# Patient Record
Sex: Female | Born: 1950 | Race: White | Hispanic: No | Smoking: Never smoker
Health system: Southern US, Community
[De-identification: ages and names within clinical notes are randomized; demographics above are authoritative.]

## PROBLEM LIST (undated history)

## (undated) DIAGNOSIS — M1990 Unspecified osteoarthritis, unspecified site: Secondary | ICD-10-CM

## (undated) DIAGNOSIS — I10 Essential (primary) hypertension: Secondary | ICD-10-CM

## (undated) DIAGNOSIS — F329 Major depressive disorder, single episode, unspecified: Secondary | ICD-10-CM

## (undated) DIAGNOSIS — C801 Malignant (primary) neoplasm, unspecified: Secondary | ICD-10-CM

## (undated) DIAGNOSIS — F32A Depression, unspecified: Secondary | ICD-10-CM

## (undated) HISTORY — PX: TONSILLECTOMY: SUR1361

## (undated) HISTORY — PX: WISDOM TOOTH EXTRACTION: SHX21

## (undated) HISTORY — PX: FOOT SURGERY: SHX648

## (undated) HISTORY — PX: CHOLECYSTECTOMY: SHX55

---

## 2006-12-30 ENCOUNTER — Emergency Department: Payer: Self-pay | Admitting: Unknown Physician Specialty

## 2006-12-30 ENCOUNTER — Other Ambulatory Visit: Payer: Self-pay

## 2008-04-09 ENCOUNTER — Ambulatory Visit: Payer: Self-pay | Admitting: Gastroenterology

## 2008-06-23 ENCOUNTER — Ambulatory Visit: Payer: Self-pay | Admitting: General Surgery

## 2008-07-18 ENCOUNTER — Other Ambulatory Visit: Payer: Self-pay

## 2008-07-18 ENCOUNTER — Ambulatory Visit: Payer: Self-pay | Admitting: General Surgery

## 2008-07-24 ENCOUNTER — Ambulatory Visit: Payer: Self-pay | Admitting: General Surgery

## 2008-09-06 ENCOUNTER — Emergency Department: Payer: Self-pay | Admitting: Emergency Medicine

## 2008-09-15 ENCOUNTER — Ambulatory Visit: Payer: Self-pay | Admitting: Internal Medicine

## 2009-05-25 ENCOUNTER — Ambulatory Visit: Payer: Self-pay | Admitting: Gastroenterology

## 2013-04-17 ENCOUNTER — Ambulatory Visit: Payer: Self-pay | Admitting: Family Medicine

## 2013-07-31 ENCOUNTER — Emergency Department: Payer: Self-pay | Admitting: Emergency Medicine

## 2014-05-20 ENCOUNTER — Ambulatory Visit: Payer: Self-pay | Admitting: Family Medicine

## 2015-05-15 ENCOUNTER — Encounter: Payer: Self-pay | Admitting: *Deleted

## 2015-05-15 ENCOUNTER — Encounter
Admission: RE | Disposition: A | Discharge status: Home / Self Care | Payer: Self-pay | Source: Ambulatory Visit | Attending: Unknown Physician Specialty

## 2015-05-15 ENCOUNTER — Ambulatory Visit: Payer: BLUE CROSS/BLUE SHIELD | Admitting: Anesthesiology

## 2015-05-15 ENCOUNTER — Ambulatory Visit
Admission: RE | Admit: 2015-05-15 | Discharge: 2015-05-15 | Disposition: A | Discharge status: Home / Self Care | Payer: BLUE CROSS/BLUE SHIELD | Source: Ambulatory Visit | Attending: Unknown Physician Specialty | Admitting: Unknown Physician Specialty

## 2015-05-15 DIAGNOSIS — Z79899 Other long term (current) drug therapy: Secondary | ICD-10-CM | POA: Insufficient documentation

## 2015-05-15 DIAGNOSIS — Z8371 Family history of colonic polyps: Secondary | ICD-10-CM | POA: Insufficient documentation

## 2015-05-15 DIAGNOSIS — Z7982 Long term (current) use of aspirin: Secondary | ICD-10-CM | POA: Diagnosis not present

## 2015-05-15 DIAGNOSIS — Z1211 Encounter for screening for malignant neoplasm of colon: Secondary | ICD-10-CM | POA: Diagnosis present

## 2015-05-15 DIAGNOSIS — D122 Benign neoplasm of ascending colon: Secondary | ICD-10-CM | POA: Diagnosis not present

## 2015-05-15 DIAGNOSIS — K648 Other hemorrhoids: Secondary | ICD-10-CM | POA: Diagnosis not present

## 2015-05-15 DIAGNOSIS — I10 Essential (primary) hypertension: Secondary | ICD-10-CM | POA: Insufficient documentation

## 2015-05-15 HISTORY — PX: COLONOSCOPY WITH PROPOFOL: SHX5780

## 2015-05-15 HISTORY — DX: Major depressive disorder, single episode, unspecified: F32.9

## 2015-05-15 HISTORY — DX: Essential (primary) hypertension: I10

## 2015-05-15 HISTORY — DX: Depression, unspecified: F32.A

## 2015-05-15 SURGERY — COLONOSCOPY WITH PROPOFOL
Anesthesia: General

## 2015-05-15 MED ORDER — PROPOFOL INFUSION 10 MG/ML OPTIME
INTRAVENOUS | Status: DC | PRN
  Administered 2015-05-15: 140 ug/kg/min via INTRAVENOUS

## 2015-05-15 MED ORDER — PROPOFOL 10 MG/ML IV BOLUS
INTRAVENOUS | Status: DC | PRN
  Administered 2015-05-15: 50 mg via INTRAVENOUS

## 2015-05-15 MED ORDER — SODIUM CHLORIDE 0.9 % IV SOLN: INTRAVENOUS | Status: DC

## 2015-05-15 MED ORDER — LIDOCAINE HCL (PF) 2 % IJ SOLN
INTRAMUSCULAR | Status: DC | PRN
  Administered 2015-05-15: 50 mg

## 2015-05-15 MED ORDER — FENTANYL CITRATE (PF) 100 MCG/2ML IJ SOLN
INTRAMUSCULAR | Status: DC | PRN
  Administered 2015-05-15: 50 ug via INTRAVENOUS

## 2015-05-15 MED ORDER — MIDAZOLAM HCL 5 MG/5ML IJ SOLN
INTRAMUSCULAR | Status: DC | PRN
  Administered 2015-05-15: 1 mg via INTRAVENOUS

## 2015-05-15 MED ORDER — SODIUM CHLORIDE 0.9 % IV SOLN
INTRAVENOUS | Status: DC
  Administered 2015-05-15: 14:00:00 via INTRAVENOUS

## 2015-05-15 NOTE — Anesthesia Postprocedure Evaluation (Signed)
  Anesthesia Post-op Note  Patient: Laurie Ryan  Procedure(s) Performed: Procedure(s): COLONOSCOPY WITH PROPOFOL (N/A)  Anesthesia type:General  Patient location: PACU  Post pain: Pain level controlled  Post assessment: Post-op Vital signs reviewed, Patient's Cardiovascular Status Stable, Respiratory Function Stable, Patent Airway and No signs of Nausea or vomiting  Post vital signs: Reviewed and stable  Last Vitals:  Filed Vitals:   05/15/15 1416  BP: 136/76  Pulse: 70  Temp:   Resp: 20    Level of consciousness: awake, alert  and patient cooperative  Complications: No apparent anesthesia complications

## 2015-05-15 NOTE — Anesthesia Preprocedure Evaluation (Addendum)
Anesthesia Evaluation  Patient identified by MRN, date of birth, ID band Patient awake    Reviewed: Allergy & Precautions, H&P , NPO status , Patient's Chart, lab work & pertinent test results, reviewed documented beta blocker date and time   Airway Mallampati: II  TM Distance: >3 FB Neck ROM: full    Dental  (+) Poor Dentition, Chipped   Pulmonary neg pulmonary ROS,  breath sounds clear to auscultation  Pulmonary exam normal       Cardiovascular hypertension, - Past MI Normal cardiovascular examRhythm:regular Rate:Normal     Neuro/Psych PSYCHIATRIC DISORDERS negative neurological ROS     GI/Hepatic negative GI ROS, Neg liver ROS,   Endo/Other  negative endocrine ROS  Renal/GU negative Renal ROS  negative genitourinary   Musculoskeletal   Abdominal   Peds  Hematology negative hematology ROS (+)   Anesthesia Other Findings Past Medical History:   Hypertension                                                 Depression                                                   Reproductive/Obstetrics negative OB ROS                            Anesthesia Physical Anesthesia Plan  ASA: II  Anesthesia Plan: General   Post-op Pain Management:    Induction:   Airway Management Planned:   Additional Equipment:   Intra-op Plan:   Post-operative Plan:   Informed Consent: I have reviewed the patients History and Physical, chart, labs and discussed the procedure including the risks, benefits and alternatives for the proposed anesthesia with the patient or authorized representative who has indicated his/her understanding and acceptance.     Plan Discussed with: Anesthesiologist, CRNA and Surgeon  Anesthesia Plan Comments:        Anesthesia Quick Evaluation

## 2015-05-15 NOTE — Transfer of Care (Signed)
Immediate Anesthesia Transfer of Care Note  Patient: Laurie Ryan  Procedure(s) Performed: Procedure(s): COLONOSCOPY WITH PROPOFOL (N/A)  Patient Location: PACU  Anesthesia Type:General  Level of Consciousness: sedated  Airway & Oxygen Therapy: Patient Spontanous Breathing and Patient connected to nasal cannula oxygen  Post-op Assessment: Report given to RN and Post -op Vital signs reviewed and stable  Post vital signs: Reviewed and stable  Last Vitals:  Filed Vitals:   05/15/15 1309  BP: 153/87  Pulse: 87  Temp: 36.3 C  Resp: 17    Complications: No apparent anesthesia complications

## 2015-05-15 NOTE — Op Note (Signed)
[facility] Patient Name: Laurie Ryan Procedure Date: 05/15/2015 1:31 PM MRN: [id number] Account #: [id number] Date of Birth: 12/30/1950 Admit Type: Outpatient Age: 64 Room: [facility] ENDO ROOM 1 Gender: Female Note Status: Finalized Procedure:         Colonoscopy Indications:       Colon cancer screening in patient at increased risk:                     Family history of colon polyps Providers:         Manya Silvas, MD Referring MD:      Irven Easterly. Kary Kos, MD (Referring MD) Medicines:         Propofol per Anesthesia Complications:     No immediate complications. Procedure:         Pre-Anesthesia Assessment:                    - After reviewing the risks and benefits, the patient was                     deemed in satisfactory condition to undergo the procedure.                    After obtaining informed consent, the colonoscope was                     passed under direct vision. Throughout the procedure, the                     patient's blood pressure, pulse, and oxygen saturations                     were monitored continuously. The Colonoscope was                     introduced through the anus and advanced to the the cecum,                     identified by appendiceal orifice and ileocecal valve. The                     colonoscopy was performed without difficulty. The patient                     tolerated the procedure well. The quality of the bowel                     preparation was excellent. Findings:      A diminutive polyp was found in the ascending colon. The polyp was       sessile. The polyp was removed with a jumbo cold forceps. Resection and       retrieval were complete.      The exam was otherwise without abnormality.      Internal hemorrhoids were found during endoscopy. The hemorrhoids were       small. Impression:        - One diminutive polyp in the ascending colon. Resected                     and retrieved.           - The examination was otherwise normal. Recommendation:    - Await pathology results. Manya Silvas, MD 05/15/2015 1:59:48 PM This report has been signed electronically. Number  of Addenda: 0 Note Initiated On: 05/15/2015 1:31 PM Scope Withdrawal Time: 0 hours 14 minutes 33 seconds  Total Procedure Duration: 0 hours 18 minutes 33 seconds       [facility]

## 2015-05-15 NOTE — H&P (Signed)
Primary Care Physician:  Maryland Pink, MD Primary Gastroenterologist:  Dr. Vira Agar  Pre-Procedure History & Physical: HPI:  Laurie Ryan is a 64 y.o. female is here for an colonoscopy.   Past Medical History  Diagnosis Date  . Hypertension   . Depression     Past Surgical History  Procedure Laterality Date  . Foot surgery    . Tonsillectomy    . Cholecystectomy      Prior to Admission medications   Medication Sig Start Date End Date Taking? Authorizing Provider  aspirin EC 81 MG tablet Take 81 mg by mouth daily.   Yes Historical Provider, MD  benazepril (LOTENSIN) 5 MG tablet Take 5 mg by mouth daily.   Yes Historical Provider, MD  escitalopram (LEXAPRO) 10 MG tablet Take 10 mg by mouth daily.   Yes Historical Provider, MD  estradiol (ESTRACE) 0.1 MG/GM vaginal cream Place 1 Applicatorful vaginally at bedtime.   Yes Historical Provider, MD  fluticasone (FLONASE) 50 MCG/ACT nasal spray Place 2 sprays into both nostrils daily.   Yes Historical Provider, MD  hydrochlorothiazide (HYDRODIURIL) 25 MG tablet Take 25 mg by mouth daily.   Yes Historical Provider, MD  Multiple Vitamin (MULTIVITAMIN) capsule Take 1 capsule by mouth daily.   Yes Historical Provider, MD  omeprazole (PRILOSEC) 20 MG capsule Take 20 mg by mouth daily.   Yes Historical Provider, MD    Allergies as of 03/31/2015  . (Not on File)    Family History  Problem Relation Age of Onset  . Heart disease Mother   . Heart disease Brother     History   Social History  . Marital Status: Married    Spouse Name: N/A  . Number of Children: N/A  . Years of Education: N/A   Occupational History  . Not on file.   Social History Main Topics  . Smoking status: Never Smoker   . Smokeless tobacco: Not on file  . Alcohol Use: No  . Drug Use: No  . Sexual Activity: Not on file   Other Topics Concern  . Not on file   Social History Narrative    Review of Systems: See HPI, otherwise negative  ROS  Physical Exam: BP 153/87 mmHg  Pulse 87  Temp(Src) 97.4 F (36.3 C) (Tympanic)  Resp 17  Ht 5\' 6"  (1.676 m)  Wt 65.772 kg (145 lb)  BMI 23.41 kg/m2 General:   Alert,  pleasant and cooperative in NAD Head:  Normocephalic and atraumatic. Neck:  Supple; no masses or thyromegaly. Lungs:  Clear throughout to auscultation.    Heart:  Regular rate and rhythm. Abdomen:  Soft, nontender and nondistended. Normal bowel sounds, without guarding, and without rebound.   Neurologic:  Alert and  oriented x4;  grossly normal neurologically.  Impression/Plan: Laurie Ryan is here for an colonoscopy to be performed for screening colon for family history of colon polyps  Risks, benefits, limitations, and alternatives regarding  colonoscopy have been reviewed with the patient.  Questions have been answered.  All parties agreeable.   Gaylyn Cheers, MD  05/15/2015, 1:33 PM   Primary Care Physician:  Mary[place] Pink, MD Primary Gastroenterologist:  Dr. Vira Agar  Pre-Procedure History & Physical: HPI:  Laurie Ryan is a 64 y.o. female is here for an colonoscopy.   Past Medical History  Diagnosis Date  . Hypertension   . Depression     Past Surgical History  Procedure Laterality Date  . Foot surgery    .  Tonsillectomy    . Cholecystectomy      Prior to Admission medications   Medication Sig Start Date End Date Taking? Authorizing Provider  aspirin EC 81 MG tablet Take 81 mg by mouth daily.   Yes Historical Provider, MD  benazepril (LOTENSIN) 5 MG tablet Take 5 mg by mouth daily.   Yes Historical Provider, MD  escitalopram (LEXAPRO) 10 MG tablet Take 10 mg by mouth daily.   Yes Historical Provider, MD  estradiol (ESTRACE) 0.1 MG/GM vaginal cream Place 1 Applicatorful vaginally at bedtime.   Yes Historical Provider, MD  fluticasone (FLONASE) 50 MCG/ACT nasal spray Place 2 sprays into both nostrils daily.   Yes Historical Provider, MD  hydrochlorothiazide (HYDRODIURIL) 25 MG  tablet Take 25 mg by mouth daily.   Yes Historical Provider, MD  Multiple Vitamin (MULTIVITAMIN) capsule Take 1 capsule by mouth daily.   Yes Historical Provider, MD  omeprazole (PRILOSEC) 20 MG capsule Take 20 mg by mouth daily.   Yes Historical Provider, MD    Allergies as of 03/31/2015  . (Not on File)    Family History  Problem Relation Age of Onset  . Heart disease Mother   . Heart disease Brother     History   Social History  . Marital Status: Married    Spouse Name: N/A  . Number of Children: N/A  . Years of Education: N/A   Occupational History  . Not on file.   Social History Main Topics  . Smoking status: Never Smoker   . Smokeless tobacco: Not on file  . Alcohol Use: No  . Drug Use: No  . Sexual Activity: Not on file   Other Topics Concern  . Not on file   Social History Narrative    Review of Systems: See HPI, otherwise negative ROS  Physical Exam: BP 153/87 mmHg  Pulse 87  Temp(Src) 97.4 F (36.3 C) (Tympanic)  Resp 17  Ht 5\' 6"  (1.676 m)  Wt 65.772 kg (145 lb)  BMI 23.41 kg/m2 General:   Alert,  pleasant and cooperative in NAD Head:  Normocephalic and atraumatic. Neck:  Supple; no masses or thyromegaly. Lungs:  Clear throughout to auscultation.    Heart:  Regular rate and rhythm. Abdomen:  Soft, nontender and nondistended. Normal bowel sounds, without guarding, and without rebound.   Neurologic:  Alert and  oriented x4;  grossly normal neurologically.  Impression/Plan: Laurie Ryan is here for an colonoscopy to be performed for screening colonoscopy.  Risks, benefits, limitations, and alternatives regarding  colonoscopy have been reviewed with the patient.  Questions have been answered.  All parties agreeable.   Gaylyn Cheers, MD  05/15/2015, 1:33 PM

## 2015-05-18 ENCOUNTER — Encounter: Payer: Self-pay | Admitting: Unknown Physician Specialty

## 2015-05-19 LAB — SURGICAL PATHOLOGY

## 2015-08-07 ENCOUNTER — Other Ambulatory Visit: Payer: Self-pay | Admitting: Family Medicine

## 2015-08-07 DIAGNOSIS — Z1231 Encounter for screening mammogram for malignant neoplasm of breast: Secondary | ICD-10-CM

## 2015-08-21 ENCOUNTER — Ambulatory Visit: Admission: RE | Admit: 2015-08-21 | Payer: BLUE CROSS/BLUE SHIELD | Source: Ambulatory Visit

## 2015-08-28 ENCOUNTER — Ambulatory Visit
Admission: RE | Admit: 2015-08-28 | Discharge: 2015-08-28 | Disposition: A | Discharge status: Home / Self Care | Payer: BLUE CROSS/BLUE SHIELD | Source: Ambulatory Visit | Attending: Family Medicine | Admitting: Family Medicine

## 2015-08-28 DIAGNOSIS — Z1231 Encounter for screening mammogram for malignant neoplasm of breast: Secondary | ICD-10-CM | POA: Insufficient documentation

## 2015-08-28 HISTORY — DX: Malignant (primary) neoplasm, unspecified: C80.1

## 2015-09-02 ENCOUNTER — Other Ambulatory Visit: Payer: Self-pay | Admitting: Family Medicine

## 2015-09-02 DIAGNOSIS — R928 Other abnormal and inconclusive findings on diagnostic imaging of breast: Secondary | ICD-10-CM

## 2015-09-17 ENCOUNTER — Ambulatory Visit
Admission: RE | Admit: 2015-09-17 | Discharge: 2015-09-17 | Disposition: A | Discharge status: Home / Self Care | Payer: BLUE CROSS/BLUE SHIELD | Source: Ambulatory Visit | Attending: Family Medicine | Admitting: Family Medicine

## 2015-09-17 DIAGNOSIS — R928 Other abnormal and inconclusive findings on diagnostic imaging of breast: Secondary | ICD-10-CM | POA: Insufficient documentation

## 2016-02-10 ENCOUNTER — Encounter: Payer: Self-pay | Admitting: Urgent Care

## 2016-02-10 DIAGNOSIS — Z7982 Long term (current) use of aspirin: Secondary | ICD-10-CM | POA: Diagnosis not present

## 2016-02-10 DIAGNOSIS — F329 Major depressive disorder, single episode, unspecified: Secondary | ICD-10-CM | POA: Insufficient documentation

## 2016-02-10 DIAGNOSIS — Z79899 Other long term (current) drug therapy: Secondary | ICD-10-CM | POA: Insufficient documentation

## 2016-02-10 DIAGNOSIS — R0789 Other chest pain: Secondary | ICD-10-CM | POA: Insufficient documentation

## 2016-02-10 DIAGNOSIS — Z85828 Personal history of other malignant neoplasm of skin: Secondary | ICD-10-CM | POA: Insufficient documentation

## 2016-02-10 DIAGNOSIS — I10 Essential (primary) hypertension: Secondary | ICD-10-CM | POA: Diagnosis not present

## 2016-02-10 DIAGNOSIS — J209 Acute bronchitis, unspecified: Secondary | ICD-10-CM | POA: Insufficient documentation

## 2016-02-10 NOTE — ED Notes (Signed)
Patient presents to ED with c/p retrosternal CP that began while patient was sitting. (+) nausea, SOB, and elevated BP. Of note, patient has bbeen "sick" x 2 weeks with respiratory illness; better now. (+) N/V/D last couple of weeks as well.

## 2016-02-11 ENCOUNTER — Other Ambulatory Visit: Payer: BLUE CROSS/BLUE SHIELD

## 2016-02-11 ENCOUNTER — Emergency Department: Payer: BLUE CROSS/BLUE SHIELD

## 2016-02-11 ENCOUNTER — Emergency Department
Admission: EM | Admit: 2016-02-11 | Discharge: 2016-02-11 | Disposition: A | Discharge status: Home / Self Care | Payer: BLUE CROSS/BLUE SHIELD | Attending: Emergency Medicine | Admitting: Emergency Medicine

## 2016-02-11 DIAGNOSIS — J40 Bronchitis, not specified as acute or chronic: Secondary | ICD-10-CM

## 2016-02-11 DIAGNOSIS — R079 Chest pain, unspecified: Secondary | ICD-10-CM

## 2016-02-11 LAB — BASIC METABOLIC PANEL
ANION GAP: 5 (ref 5–15)
BUN: 16 mg/dL (ref 6–20)
CALCIUM: 8.7 mg/dL — AB (ref 8.9–10.3)
CO2: 25 mmol/L (ref 22–32)
Chloride: 109 mmol/L (ref 101–111)
Creatinine, Ser: 0.98 mg/dL (ref 0.44–1.00)
GFR calc Af Amer: >60 mL/min (ref >60)
GFR calc non Af Amer: >60 mL/min (ref >60)
GLUCOSE: 164 mg/dL — AB (ref 65–99)
POTASSIUM: 3.8 mmol/L (ref 3.5–5.1)
Sodium: 139 mmol/L (ref 135–145)

## 2016-02-11 LAB — CBC
HCT: 40.2 % (ref 35.0–47.0)
HEMOGLOBIN: 13.8 g/dL (ref 12.0–16.0)
MCH: 32 pg (ref 26.0–34.0)
MCHC: 34.3 g/dL (ref 32.0–36.0)
MCV: 93.3 fL (ref 80.0–100.0)
Platelets: 154 10*3/uL (ref 150–440)
RBC: 4.3 MIL/uL (ref 3.80–5.20)
RDW: 12.5 % (ref 11.5–14.5)
WBC: 4.2 10*3/uL (ref 3.6–11.0)

## 2016-02-11 LAB — TROPONIN I: Troponin I: <0.03 ng/mL (ref <0.031)

## 2016-02-11 MED ORDER — AZITHROMYCIN 250 MG PO TABS: 250.0000 mg | ORAL_TABLET | Freq: Every day | ORAL | Status: DC

## 2016-02-11 MED ORDER — METHYLPREDNISOLONE 4 MG PO TBPK: ORAL_TABLET | ORAL | Status: DC

## 2016-02-11 MED ORDER — AZITHROMYCIN 500 MG PO TABS
500.0000 mg | ORAL_TABLET | Freq: Once | ORAL | Status: AC
  Administered 2016-02-11: 500 mg via ORAL
  Filled 2016-02-11: qty 1

## 2016-02-11 MED ORDER — PREDNISONE 20 MG PO TABS
40.0000 mg | ORAL_TABLET | Freq: Once | ORAL | Status: AC
  Administered 2016-02-11: 40 mg via ORAL
  Filled 2016-02-11: qty 2

## 2016-02-11 MED ORDER — ALBUTEROL SULFATE HFA 108 (90 BASE) MCG/ACT IN AERS: 2.0000 | INHALATION_SPRAY | RESPIRATORY_TRACT | Status: DC | PRN

## 2016-02-11 NOTE — ED Notes (Signed)
Pt reports chest pressure " for 2 weeks" Pt reports having cough and URI. Pt deneis CP or other symptoms. Pt was seen at clinic and advised to get an over the counter medication.  Pt in no acute distress

## 2016-02-11 NOTE — Discharge Instructions (Signed)
1. Take antibiotic as prescribed (Azithromycin 250 mg daily 4 days). 2. Take steroid taper as prescribed (Medrol Dosepak). 3. You may use albuterol inhaler 2 puffs every 4 hours as needed for cough or wheezing. 4. Return to the ER for worsening symptoms, persistent vomiting, difficulty breathing or other concerns.  Upper Respiratory Infection, Adult Most upper respiratory infections (URIs) are a viral infection of the air passages leading to the lungs. A URI affects the nose, throat, and upper air passages. The most common type of URI is nasopharyngitis and is typically referred to as "the common cold." URIs run their course and usually go away on their own. Most of the time, a URI does not require medical attention, but sometimes a bacterial infection in the upper airways can follow a viral infection. This is called a secondary infection. Sinus and middle ear infections are common types of secondary upper respiratory infections. Bacterial pneumonia can also complicate a URI. A URI can worsen asthma and chronic obstructive pulmonary disease (COPD). Sometimes, these complications can require emergency medical care and may be life threatening.  CAUSES Almost all URIs are caused by viruses. A virus is a type of germ and can spread from one person to another.  RISKS FACTORS You may be at risk for a URI if:  1. You smoke.  2. You have chronic heart or lung disease. 3. You have a weakened defense (immune) system.  4. You are very young or very old.  5. You have nasal allergies or asthma. 6. You work in crowded or poorly ventilated areas. 7. You work in health care facilities or schools. SIGNS AND SYMPTOMS  Symptoms typically develop 2-3 days after you come in contact with a cold virus. Most viral URIs last 7-10 days. However, viral URIs from the influenza virus (flu virus) can last 14-18 days and are typically more severe. Symptoms may include:   Runny or stuffy (congested) nose.   Sneezing.    Cough.   Sore throat.   Headache.   Fatigue.   Fever.   Loss of appetite.   Pain in your forehead, behind your eyes, and over your cheekbones (sinus pain).  Muscle aches.  DIAGNOSIS  Your health care provider may diagnose a URI by:  Physical exam.  Tests to check that your symptoms are not due to another condition such as:  Strep throat.  Sinusitis.  Pneumonia.  Asthma. TREATMENT  A URI goes away on its own with time. It cannot be cured with medicines, but medicines may be prescribed or recommended to relieve symptoms. Medicines may help:  Reduce your fever.  Reduce your cough.  Relieve nasal congestion. HOME CARE INSTRUCTIONS   Take medicines only as directed by your health care provider.   Gargle warm saltwater or take cough drops to comfort your throat as directed by your health care provider.  Use a warm mist humidifier or inhale steam from a shower to increase air moisture. This may make it easier to breathe.  Drink enough fluid to keep your urine clear or pale yellow.   Eat soups and other clear broths and maintain good nutrition.   Rest as needed.   Return to work when your temperature has returned to normal or as your health care provider advises. You may need to stay home longer to avoid infecting others. You can also use a face mask and careful hand washing to prevent spread of the virus.  Increase the usage of your inhaler if you have asthma.  Do not use any tobacco products, including cigarettes, chewing tobacco, or electronic cigarettes. If you need help quitting, ask your health care provider. PREVENTION  The best way to protect yourself from getting a cold is to practice good hygiene.   Avoid oral or hand contact with people with cold symptoms.   Wash your hands often if contact occurs.  There is no clear evidence that vitamin C, vitamin E, echinacea, or exercise reduces the chance of developing a cold. However, it is  always recommended to get plenty of rest, exercise, and practice good nutrition.  SEEK MEDICAL CARE IF:   You are getting worse rather than better.   Your symptoms are not controlled by medicine.   You have chills.  You have worsening shortness of breath.  You have brown or red mucus.  You have yellow or brown nasal discharge.  You have pain in your face, especially when you bend forward.  You have a fever.  You have swollen neck glands.  You have pain while swallowing.  You have white areas in the back of your throat. SEEK IMMEDIATE MEDICAL CARE IF:   You have severe or persistent:  Headache.  Ear pain.  Sinus pain.  Chest pain.  You have chronic lung disease and any of the following:  Wheezing.  Prolonged cough.  Coughing up blood.  A change in your usual mucus.  You have a stiff neck.  You have changes in your:  Vision.  Hearing.  Thinking.  Mood. MAKE SURE YOU:   Understand these instructions.  Will watch your condition.  Will get help right away if you are not doing well or get worse.   This information is not intended to replace advice given to you by your health care provider. Make sure you discuss any questions you have with your health care provider.   Document Released: 04/19/2001 Document Revised: 03/10/2015 Document Reviewed: 01/29/2014 Elsevier Interactive Patient Education 2016 [employer]  Nonspecific Chest Pain  Chest pain can be caused by many different conditions. There is always a chance that your pain could be related to something serious, such as a heart attack or a blood clot in your lungs. Chest pain can also be caused by conditions that are not life-threatening. If you have chest pain, it is very important to follow up with your health care provider. CAUSES  Chest pain can be caused by: 8. Heartburn. 9. Pneumonia or bronchitis. 10. Anxiety or stress. 11. Inflammation around your heart (pericarditis) or lung  (pleuritis or pleurisy). 12. A blood clot in your lung. 13. A collapsed lung (pneumothorax). It can develop suddenly on its own (spontaneous pneumothorax) or from trauma to the chest. 14. Shingles infection (varicella-zoster virus). 15. Heart attack. 16. Damage to the bones, muscles, and cartilage that make up your chest wall. This can include: 1. Bruised bones due to injury. 2. Strained muscles or cartilage due to frequent or repeated coughing or overwork. 3. Fracture to one or more ribs. 4. Sore cartilage due to inflammation (costochondritis). RISK FACTORS  Risk factors for chest pain may include:  Activities that increase your risk for trauma or injury to your chest.  Respiratory infections or conditions that cause frequent coughing.  Medical conditions or overeating that can cause heartburn.  Heart disease or family history of heart disease.  Conditions or health behaviors that increase your risk of developing a blood clot.  Having had chicken pox (varicella zoster). SIGNS AND SYMPTOMS Chest pain can feel like:  Burning  or tingling on the surface of your chest or deep in your chest.  Crushing, pressure, aching, or squeezing pain.  Dull or sharp pain that is worse when you move, cough, or take a deep breath.  Pain that is also felt in your back, neck, shoulder, or arm, or pain that spreads to any of these areas. Your chest pain may come and go, or it may stay constant. DIAGNOSIS Lab tests or other studies may be needed to find the cause of your pain. Your health care provider may have you take a test called an ambulatory ECG (electrocardiogram). An ECG records your heartbeat patterns at the time the test is performed. You may also have other tests, such as:  Transthoracic echocardiogram (TTE). During echocardiography, sound waves are used to create a picture of all of the heart structures and to look at how blood flows through your heart.  Transesophageal echocardiogram  (TEE).This is a more advanced imaging test that obtains images from inside your body. It allows your health care provider to see your heart in finer detail.  Cardiac monitoring. This allows your health care provider to monitor your heart rate and rhythm in real time.  Holter monitor. This is a portable device that records your heartbeat and can help to diagnose abnormal heartbeats. It allows your health care provider to track your heart activity for several days, if needed.  Stress tests. These can be done through exercise or by taking medicine that makes your heart beat more quickly.  Blood tests.  Imaging tests. TREATMENT  Your treatment depends on what is causing your chest pain. Treatment may include:  Medicines. These may include:  Acid blockers for heartburn.  Anti-inflammatory medicine.  Pain medicine for inflammatory conditions.  Antibiotic medicine, if an infection is present.  Medicines to dissolve blood clots.  Medicines to treat coronary artery disease.  Supportive care for conditions that do not require medicines. This may include:  Resting.  Applying heat or cold packs to injured areas.  Limiting activities until pain decreases. HOME CARE INSTRUCTIONS  If you were prescribed an antibiotic medicine, finish it all even if you start to feel better.  Avoid any activities that bring on chest pain.  Do not use any tobacco products, including cigarettes, chewing tobacco, or electronic cigarettes. If you need help quitting, ask your health care provider.  Do not drink alcohol.  Take medicines only as directed by your health care provider.  Keep all follow-up visits as directed by your health care provider. This is important. This includes any further testing if your chest pain does not go away.  If heartburn is the cause for your chest pain, you may be told to keep your head raised (elevated) while sleeping. This reduces the chance that acid will go from your  stomach into your esophagus.  Make lifestyle changes as directed by your health care provider. These may include:  Getting regular exercise. Ask your health care provider to suggest some activities that are safe for you.  Eating a heart-healthy diet. A registered dietitian can help you to learn healthy eating options.  Maintaining a healthy weight.  Managing diabetes, if necessary.  Reducing stress. SEEK MEDICAL CARE IF:  Your chest pain does not go away after treatment.  You have a rash with blisters on your chest.  You have a fever. SEEK IMMEDIATE MEDICAL CARE IF:   Your chest pain is worse.  You have an increasing cough, or you cough up blood.  You have  severe abdominal pain.  You have severe weakness.  You faint.  You have chills.  You have sudden, unexplained chest discomfort.  You have sudden, unexplained discomfort in your arms, back, neck, or jaw.  You have shortness of breath at any time.  You suddenly start to sweat, or your skin gets clammy.  You feel nauseous or you vomit.  You suddenly feel light-headed or dizzy.  Your heart begins to beat quickly, or it feels like it is skipping beats. These symptoms may represent a serious problem that is an emergency. Do not wait to see if the symptoms will go away. Get medical help right away. Call your local emergency services (911 in the [place] Do not drive yourself to the hospital.   This information is not intended to replace advice given to you by your health care provider. Make sure you discuss any questions you have with your health care provider.   Document Released: 08/03/2005 Document Revised: 11/14/2014 Document Reviewed: 05/30/2014 Elsevier Interactive Patient Education 2016 Elsevier Inc.  Metered Dose Inhaler (No Spacer Used) Inhaled medicines are the basis of treatment for asthma and other breathing problems. Inhaled medicine can only be effective if used properly. Good technique assures that the  medicine reaches the lungs. Metered dose inhalers (MDIs) are used to deliver a variety of inhaled medicines. These include quick relief or rescue medicines (such as bronchodilators) and controller medicines (such as corticosteroids). The medicine is delivered by pushing down on a metal canister to release a set amount of spray. If you are using different kinds of inhalers, use your quick relief medicine to open the airways 10-15 minutes before using a steroid, if instructed to do so by your health care provider. If you are unsure which inhalers to use and the order of using them, ask your health care provider, nurse, or respiratory therapist. HOW TO USE THE INHALER 17. Remove the cap from the inhaler. 18. If you are using the inhaler for the first time, you will need to prime it. Shake the inhaler for 5 seconds and release four puffs into the air, away from your face. Ask your health care provider or pharmacist if you have questions about priming your inhaler. 19. Shake the inhaler for 5 seconds before each breath in (inhalation). 20. Position the inhaler so that the top of the canister faces up. 21. Put your index finger on the top of the medicine canister. Your thumb supports the bottom of the inhaler. 22. Open your mouth. 23. Either place the inhaler between your teeth and place your lips tightly around the mouthpiece, or hold the inhaler 1-2 inches away from your open mouth. If you are unsure of which technique to use, ask your health care provider. 24. Breathe out (exhale) normally and as completely as possible. 25. Press the canister down with the index finger to release the medicine. 26. At the same time as the canister is pressed, inhale deeply and slowly until your lungs are completely filled. This should take 4-6 seconds. Keep your tongue down. 27. Hold the medicine in your lungs for 5-10 seconds (10 seconds is best). This helps the medicine get into the small airways of your  lungs. 28. Breathe out slowly, through pursed lips. Whistling is an example of pursed lips. 29. Wait at least 1 minute between puffs. Continue with the above steps until you have taken the number of puffs your health care provider has ordered. Do not use the inhaler more than your health care provider  directs you to. 30. Replace the cap on the inhaler. 31. Follow the directions from your health care provider or the inhaler insert for cleaning the inhaler. If you are using a steroid inhaler, after your last puff, rinse your mouth with water, gargle, and spit out the water. Do not swallow the water. AVOID:  Inhaling before or after starting the spray of medicine. It takes practice to coordinate your breathing with triggering the spray.  Inhaling through the nose (rather than the mouth) when triggering the spray. HOW TO DETERMINE IF YOUR INHALER IS FULL OR NEARLY EMPTY You cannot know when an inhaler is empty by shaking it. Some inhalers are now being made with dose counters. Ask your health care provider for a prescription that has a dose counter if you feel you need that extra help. If your inhaler does not have a counter, ask your health care provider to help you determine the date you need to refill your inhaler. Write the refill date on a calendar or your inhaler canister. Refill your inhaler 7-10 days before it runs out. Be sure to keep an adequate supply of medicine. This includes making sure it has not expired, and making sure you have a spare inhaler. SEEK MEDICAL CARE IF:  Symptoms are only partially relieved with your inhaler.  You are having trouble using your inhaler.  You experience an increase in phlegm. SEEK IMMEDIATE MEDICAL CARE IF:  You feel little or no relief with your inhalers. You are still wheezing and feeling shortness of breath, tightness in your chest, or both.  You have dizziness, headaches, or a fast heart rate.  You have chills, fever, or night sweats.  There is  a noticeable increase in phlegm production, or there is blood in the phlegm. MAKE SURE YOU:  Understand these instructions.  Will watch your condition.  Will get help right away if you are not doing well or get worse.   This information is not intended to replace advice given to you by your health care provider. Make sure you discuss any questions you have with your health care provider.   Document Released: 08/21/2007 Document Revised: 11/14/2014 Document Reviewed: 04/11/2013 Elsevier Interactive Patient Education [employer].

## 2016-02-11 NOTE — ED Provider Notes (Signed)
[facility] Emergency Department Provider Note  ____________________________________________  Time seen: Approximately 3:46 AM  I have reviewed the triage vital signs and the nursing notes.   HISTORY  Chief Complaint Chest Pain    HPI Laurie Ryan is a 65 y.o. female who presents to the ED from home with a chief complaint of chest pain. Patient reports chest pressure/tightness for 2 weeks. Symptoms associated with cough productive of green sputum, chills, nasal and chest congestion. She has also had some intermittent nausea, vomiting and diarrhea over the past 2 weeks. DP last week who advised her to take over-the-counter loratadine, Flonase and Mucinex. She has been taking these without relief of symptoms.Presents tonight secondary to coughing spasm followed by elevated blood pressure at home. Denies associated symptoms of fever, headache, vision changes, neck pain, shortness of breath, abdominal pain. Denies recent travel or trauma.   Past Medical History  Diagnosis Date  . Hypertension   . Depression   . Cancer (Home)     skin ca    There are no active problems to display for this patient.   Past Surgical History  Procedure Laterality Date  . Foot surgery    . Tonsillectomy    . Cholecystectomy    . Colonoscopy with propofol N/A 05/15/2015    Procedure: COLONOSCOPY WITH PROPOFOL;  Surgeon: Manya Silvas, MD;  Location: [facility] ENDOSCOPY;  Service: Endoscopy;  Laterality: N/A;    Current Outpatient Rx  Name  Route  Sig  Dispense  Refill  . aspirin EC 81 MG tablet   Oral   Take 81 mg by mouth daily.         . benazepril (LOTENSIN) 5 MG tablet   Oral   Take 5 mg by mouth daily.         Marland Kitchen escitalopram (LEXAPRO) 10 MG tablet   Oral   Take 10 mg by mouth daily.         Marland Kitchen estradiol (ESTRACE) 0.1 MG/GM vaginal cream   Vaginal   Place 1 Applicatorful vaginally at bedtime.         . fluticasone (FLONASE) 50 MCG/ACT nasal spray  Each Nare   Place 2 sprays into both nostrils daily.         . hydrochlorothiazide (HYDRODIURIL) 25 MG tablet   Oral   Take 25 mg by mouth daily.         . Multiple Vitamin (MULTIVITAMIN) capsule   Oral   Take 1 capsule by mouth daily.         Marland Kitchen omeprazole (PRILOSEC) 20 MG capsule   Oral   Take 20 mg by mouth daily.           Allergies Codeine  Family History  Problem Relation Age of Onset  . Heart disease Mother   . Heart disease Brother     Social History Social History  Substance Use Topics  . Smoking status: Never Smoker   . Smokeless tobacco: None  . Alcohol Use: No    Review of Systems  Constitutional: Positive for chills. No fever. Eyes: No visual changes. ENT: No sore throat. Cardiovascular: Positive for chest tightness. Respiratory: Positive for productive cough. Denies shortness of breath. Gastrointestinal: No abdominal pain.  No nausea, no vomiting.  No diarrhea.  No constipation. Genitourinary: Negative for dysuria. Musculoskeletal: Negative for back pain. Skin: Negative for rash. Neurological: Negative for headaches, focal weakness or numbness.  10-point ROS otherwise negative.  ____________________________________________   PHYSICAL EXAM:  VITAL SIGNS: ED Triage Vitals  Enc Vitals Group     BP 02/10/16 2355 177/78 mmHg     Pulse Rate 02/10/16 2355 91     Resp --      Temp 02/10/16 2355 98.1 F (36.7 C)     Temp Source 02/10/16 2355 Oral     SpO2 02/10/16 2355 97 %     Weight 02/10/16 2351 140 lb (63.504 kg)     Height 02/10/16 2351 5\' 5"  (1.651 m)     Head Cir --      Peak Flow --      Pain Score 02/10/16 2351 4     Pain Loc --      Pain Edu? --      Excl. in [place]? --     Constitutional: Alert and oriented. Well appearing and in no acute distress. Eyes: Conjunctivae are normal. PERRL. EOMI. Head: Atraumatic. Nose: Congestion/rhinnorhea. Mouth/Throat: Mucous membranes are moist.  Oropharynx non-erythematous. Neck: No  stridor.   Cardiovascular: Normal rate, regular rhythm. Grossly normal heart sounds.  Good peripheral circulation. Respiratory: Normal respiratory effort.  No retractions. Lungs CTAB. Active dry cough. Gastrointestinal: Soft and nontender. No distention. No abdominal bruits. No CVA tenderness. Musculoskeletal: No lower extremity tenderness nor edema.  No joint effusions. Neurologic:  Normal speech and language. No gross focal neurologic deficits are appreciated. No gait instability. Skin:  Skin is warm, dry and intact. No rash noted. Psychiatric: Mood and affect are normal. Speech and behavior are normal.  ____________________________________________   LABS (all labs ordered are listed, but only abnormal results are displayed)  Labs Reviewed  BASIC METABOLIC PANEL - Abnormal; Notable for the following:    Glucose, Bld 164 (*)    Calcium 8.7 (*)    All other components within normal limits  CBC  TROPONIN I   ____________________________________________  EKG  ED ECG REPORT I, Karelyn Brisby J, the attending physician, personally viewed and interpreted this ECG.   Date: 02/11/2016  EKG Time: 2352  Rate: 92  Rhythm: normal EKG, normal sinus rhythm  Axis: Normal  Intervals:none  ST&T Change: Nonspecific  ____________________________________________  RADIOLOGY  Chest 2 view (view by me, interpreted per Dr. Marisue Humble): No acute pulmonary process ____________________________________________   PROCEDURES  Procedure(s) performed: None  Critical Care performed: No  ____________________________________________   INITIAL IMPRESSION / ASSESSMENT AND PLAN / ED COURSE  Pertinent labs & imaging results that were available during my care of the patient were reviewed by me and considered in my medical decision making (see chart for details).  65 year old female who presents with a two-week history of chest tightness associated with bronchitis symptoms. Low suspicion for ACS in light  of URI type symptoms and negative troponin in the setting of chest tightness 2 weeks. Will treat with azithromycin, steroid taper and albuterol MDI. Strict return precautions given. Patient and mother verbalize understanding and agree with plan of care. ____________________________________________   FINAL CLINICAL IMPRESSION(S) / ED DIAGNOSES  Final diagnoses:  Bronchitis  Chest pain, unspecified chest pain type      Paulette Blanch, MD 02/11/16 [phone]

## 2016-10-06 ENCOUNTER — Other Ambulatory Visit: Payer: Self-pay | Admitting: Family Medicine

## 2016-10-06 DIAGNOSIS — Z1231 Encounter for screening mammogram for malignant neoplasm of breast: Secondary | ICD-10-CM

## 2016-11-14 ENCOUNTER — Ambulatory Visit
Admission: RE | Admit: 2016-11-14 | Discharge: 2016-11-14 | Disposition: A | Discharge status: Home / Self Care | Payer: BLUE CROSS/BLUE SHIELD | Source: Ambulatory Visit | Attending: Family Medicine | Admitting: Family Medicine

## 2016-11-14 DIAGNOSIS — Z1231 Encounter for screening mammogram for malignant neoplasm of breast: Secondary | ICD-10-CM

## 2017-02-16 IMAGING — MG MM DIGITAL SCREENING BILAT W/ CAD
1 series · 4 of 4 positions shown · non-contrast
Comparison: Previous exam(s).

CLINICAL DATA: Screening.

EXAM:
DIGITAL SCREENING BILATERAL MAMMOGRAM WITH CAD

[R CC · right · 4 of 4 slices shown]
[im 1/4]
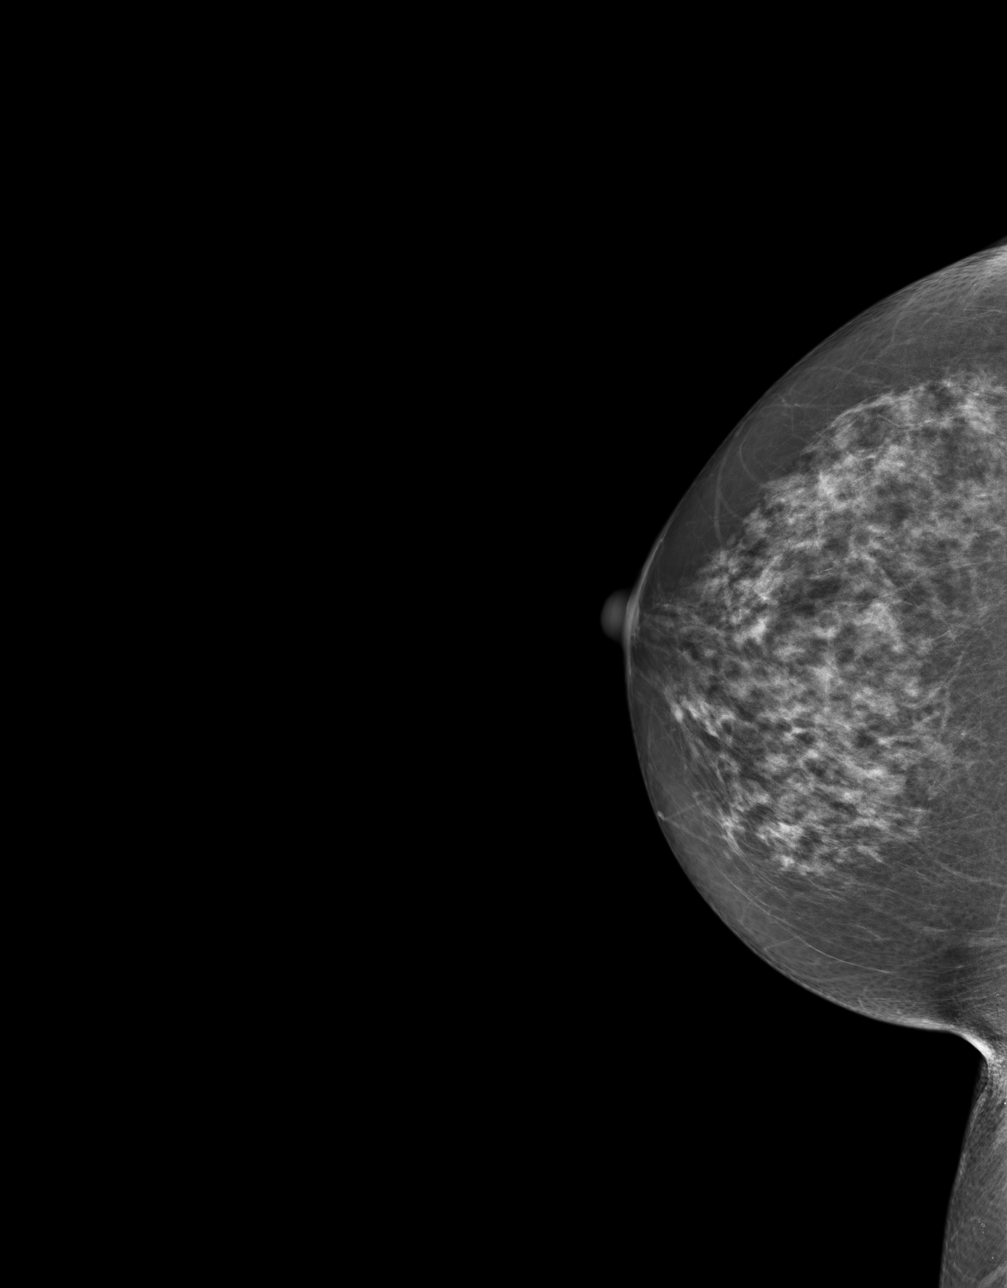
[im 2/4]
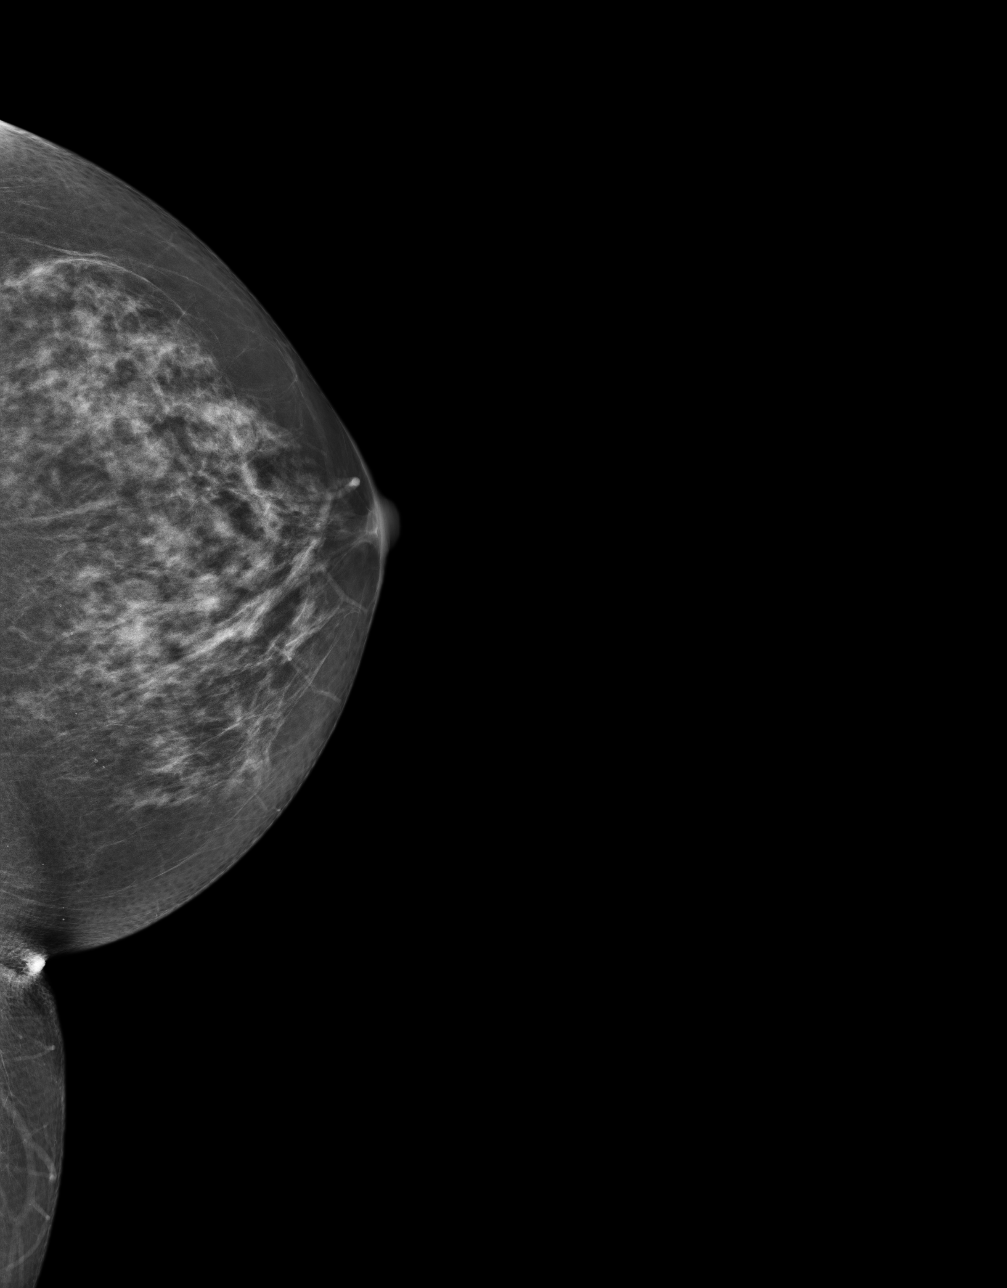
[im 3/4]
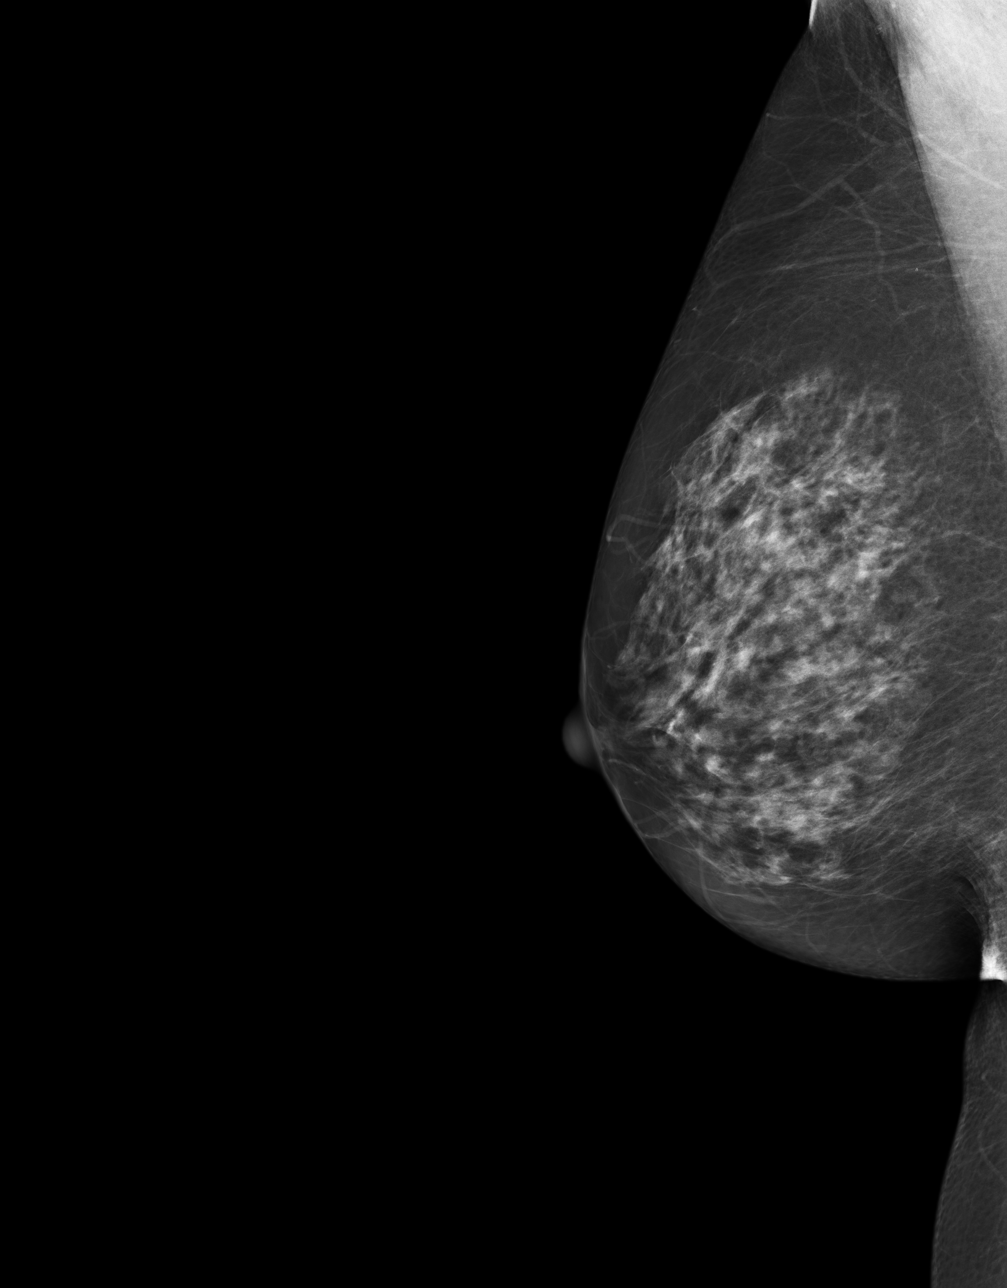
[im 4/4]
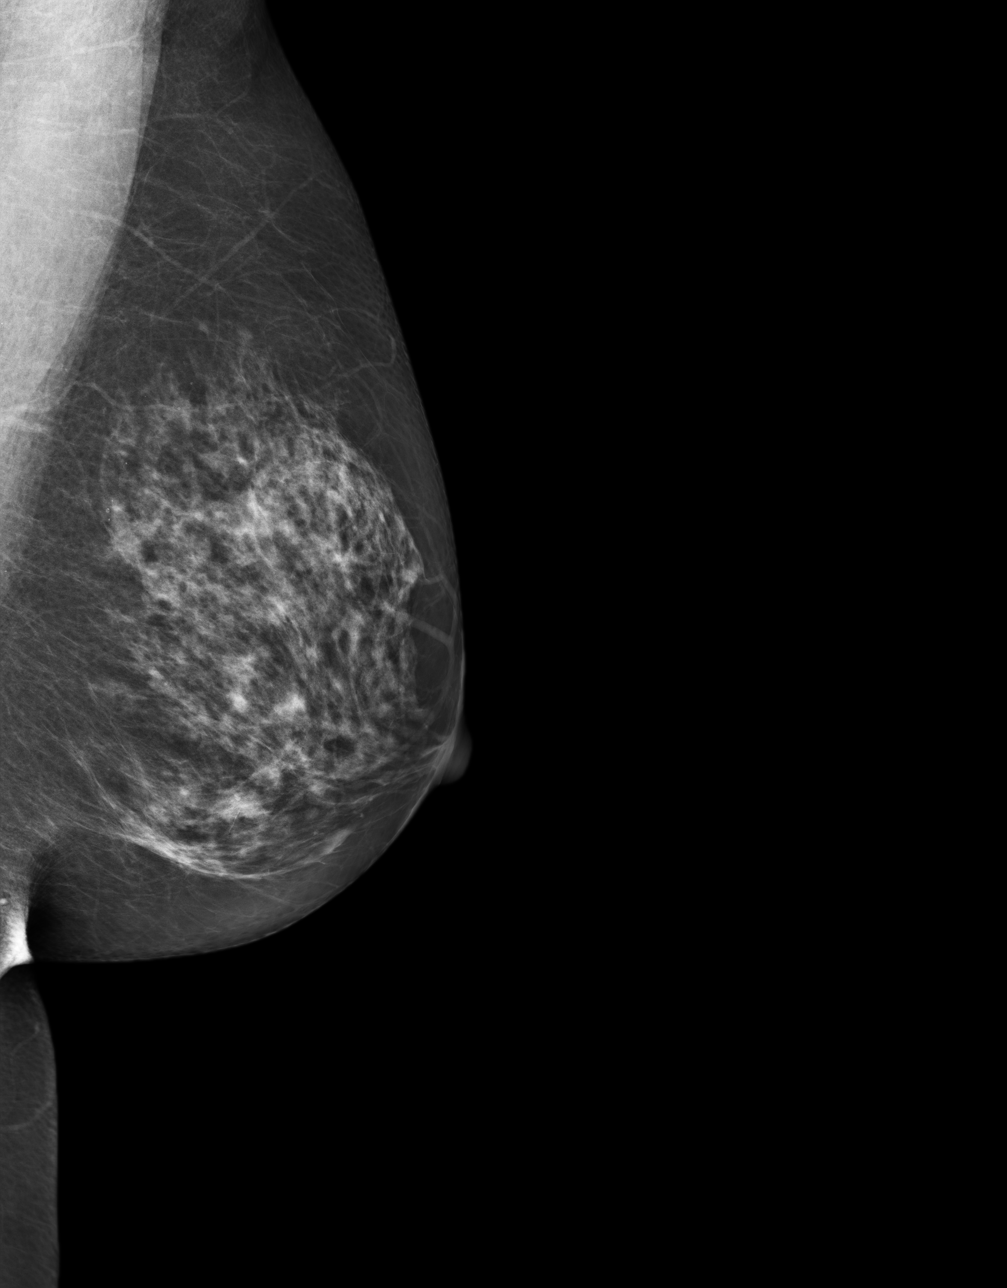

[4 of 4 positions shown; findings below may reference images not displayed]

ACR Breast Density Category c: The breast tissue is heterogeneously
dense, which may obscure small masses.
FINDINGS: In the left breast, possible distortion warrants further evaluation.
In the right breast, no findings suspicious for malignancy. Images
were processed with CAD.
IMPRESSION: Further evaluation is suggested for possible distortion in the left
breast.

RECOMMENDATION:
Diagnostic mammogram and possibly ultrasound of the left breast.
(Code:YG-9-00H)

The patient will be contacted regarding the findings, and additional
imaging will be scheduled.

BI-RADS CATEGORY  0: Incomplete. Need additional imaging evaluation
and/or prior mammograms for comparison.

## 2017-12-20 ENCOUNTER — Other Ambulatory Visit: Payer: Self-pay | Admitting: Family Medicine

## 2018-04-19 ENCOUNTER — Other Ambulatory Visit: Payer: Self-pay | Admitting: Family Medicine

## 2018-04-19 DIAGNOSIS — Z1231 Encounter for screening mammogram for malignant neoplasm of breast: Secondary | ICD-10-CM

## 2018-08-15 ENCOUNTER — Other Ambulatory Visit: Payer: Self-pay | Admitting: Sports Medicine

## 2018-08-15 DIAGNOSIS — M19011 Primary osteoarthritis, right shoulder: Secondary | ICD-10-CM

## 2018-08-15 DIAGNOSIS — M25511 Pain in right shoulder: Principal | ICD-10-CM

## 2018-08-15 DIAGNOSIS — M7541 Impingement syndrome of right shoulder: Secondary | ICD-10-CM

## 2018-08-15 DIAGNOSIS — G8929 Other chronic pain: Secondary | ICD-10-CM

## 2018-08-20 ENCOUNTER — Ambulatory Visit
Admission: RE | Admit: 2018-08-20 | Discharge: 2018-08-20 | Disposition: A | Discharge status: Home / Self Care | Payer: BLUE CROSS/BLUE SHIELD | Source: Ambulatory Visit | Attending: Sports Medicine | Admitting: Sports Medicine

## 2018-08-20 DIAGNOSIS — G8929 Other chronic pain: Secondary | ICD-10-CM

## 2018-08-20 DIAGNOSIS — M19011 Primary osteoarthritis, right shoulder: Secondary | ICD-10-CM

## 2018-08-20 DIAGNOSIS — M7541 Impingement syndrome of right shoulder: Secondary | ICD-10-CM

## 2018-08-20 DIAGNOSIS — M25511 Pain in right shoulder: Principal | ICD-10-CM

## 2018-08-23 ENCOUNTER — Encounter: Payer: Self-pay | Admitting: *Deleted

## 2018-08-23 ENCOUNTER — Other Ambulatory Visit: Payer: Self-pay

## 2018-09-04 ENCOUNTER — Ambulatory Visit
Admission: RE | Admit: 2018-09-04 | Discharge: 2018-09-04 | Disposition: A | Discharge status: Home / Self Care | Payer: BLUE CROSS/BLUE SHIELD | Source: Ambulatory Visit | Attending: Orthopedic Surgery | Admitting: Orthopedic Surgery

## 2018-09-04 ENCOUNTER — Ambulatory Visit: Payer: BLUE CROSS/BLUE SHIELD | Admitting: Anesthesiology

## 2018-09-04 ENCOUNTER — Encounter
Admission: RE | Disposition: A | Discharge status: Home / Self Care | Payer: Self-pay | Source: Ambulatory Visit | Attending: Orthopedic Surgery

## 2018-09-04 DIAGNOSIS — M19011 Primary osteoarthritis, right shoulder: Secondary | ICD-10-CM | POA: Insufficient documentation

## 2018-09-04 DIAGNOSIS — M75101 Unspecified rotator cuff tear or rupture of right shoulder, not specified as traumatic: Secondary | ICD-10-CM | POA: Diagnosis present

## 2018-09-04 DIAGNOSIS — Z7982 Long term (current) use of aspirin: Secondary | ICD-10-CM | POA: Diagnosis not present

## 2018-09-04 DIAGNOSIS — Z8371 Family history of colonic polyps: Secondary | ICD-10-CM | POA: Diagnosis not present

## 2018-09-04 DIAGNOSIS — F329 Major depressive disorder, single episode, unspecified: Secondary | ICD-10-CM | POA: Insufficient documentation

## 2018-09-04 DIAGNOSIS — S46011A Strain of muscle(s) and tendon(s) of the rotator cuff of right shoulder, initial encounter: Secondary | ICD-10-CM | POA: Insufficient documentation

## 2018-09-04 DIAGNOSIS — Z8249 Family history of ischemic heart disease and other diseases of the circulatory system: Secondary | ICD-10-CM | POA: Diagnosis not present

## 2018-09-04 DIAGNOSIS — M7581 Other shoulder lesions, right shoulder: Secondary | ICD-10-CM | POA: Diagnosis not present

## 2018-09-04 DIAGNOSIS — Z79899 Other long term (current) drug therapy: Secondary | ICD-10-CM | POA: Insufficient documentation

## 2018-09-04 DIAGNOSIS — Z7951 Long term (current) use of inhaled steroids: Secondary | ICD-10-CM | POA: Diagnosis not present

## 2018-09-04 DIAGNOSIS — Z9049 Acquired absence of other specified parts of digestive tract: Secondary | ICD-10-CM | POA: Diagnosis not present

## 2018-09-04 DIAGNOSIS — I10 Essential (primary) hypertension: Secondary | ICD-10-CM | POA: Diagnosis not present

## 2018-09-04 DIAGNOSIS — X500XXA Overexertion from strenuous movement or load, initial encounter: Secondary | ICD-10-CM | POA: Insufficient documentation

## 2018-09-04 DIAGNOSIS — M7541 Impingement syndrome of right shoulder: Secondary | ICD-10-CM | POA: Diagnosis not present

## 2018-09-04 DIAGNOSIS — Z885 Allergy status to narcotic agent status: Secondary | ICD-10-CM | POA: Diagnosis not present

## 2018-09-04 HISTORY — DX: Unspecified osteoarthritis, unspecified site: M19.90

## 2018-09-04 HISTORY — PX: SHOULDER OPEN ROTATOR CUFF REPAIR: SHX2407

## 2018-09-04 HISTORY — PX: BICEPT TENODESIS: SHX5116

## 2018-09-04 HISTORY — PX: SHOULDER ARTHROSCOPY: SHX128

## 2018-09-04 SURGERY — ARTHROSCOPY, SHOULDER
Anesthesia: Regional | Site: Shoulder | Laterality: Right

## 2018-09-04 MED ORDER — BUPIVACAINE HCL (PF) 0.5 % IJ SOLN
INTRAMUSCULAR | Status: DC | PRN
  Administered 2018-09-04: 100 mg

## 2018-09-04 MED ORDER — ACETAMINOPHEN 325 MG PO TABS: 325.0000 mg | ORAL_TABLET | ORAL | Status: DC | PRN

## 2018-09-04 MED ORDER — FENTANYL CITRATE (PF) 100 MCG/2ML IJ SOLN: 25.0000 ug | INTRAMUSCULAR | Status: DC | PRN

## 2018-09-04 MED ORDER — ONDANSETRON 4 MG PO TBDP: 4.0000 mg | ORAL_TABLET | Freq: Three times a day (TID) | ORAL | 0 refills | Status: DC | PRN

## 2018-09-04 MED ORDER — DEXAMETHASONE SODIUM PHOSPHATE 4 MG/ML IJ SOLN: INTRAMUSCULAR | Status: DC | PRN

## 2018-09-04 MED ORDER — PROPOFOL 10 MG/ML IV BOLUS
INTRAVENOUS | Status: DC | PRN
  Administered 2018-09-04: 120 mg via INTRAVENOUS

## 2018-09-04 MED ORDER — ONDANSETRON HCL 4 MG/2ML IJ SOLN
INTRAMUSCULAR | Status: DC | PRN
  Administered 2018-09-04: 4 mg via INTRAVENOUS

## 2018-09-04 MED ORDER — ASPIRIN EC 325 MG PO TBEC: 325.0000 mg | DELAYED_RELEASE_TABLET | Freq: Every day | ORAL | 0 refills | Status: AC

## 2018-09-04 MED ORDER — ACETAMINOPHEN 500 MG PO TABS: 1000.0000 mg | ORAL_TABLET | Freq: Three times a day (TID) | ORAL | 2 refills | Status: AC

## 2018-09-04 MED ORDER — LACTATED RINGERS IV SOLN
10.0000 mL/h | INTRAVENOUS | Status: DC
  Administered 2018-09-04: 14:00:00 via INTRAVENOUS
  Administered 2018-09-04: 10 mL/h via INTRAVENOUS

## 2018-09-04 MED ORDER — DEXAMETHASONE SODIUM PHOSPHATE 4 MG/ML IJ SOLN
INTRAMUSCULAR | Status: DC | PRN
  Administered 2018-09-04: 4 mg via INTRAVENOUS

## 2018-09-04 MED ORDER — BUPIVACAINE LIPOSOME 1.3 % IJ SUSP
INTRAMUSCULAR | Status: DC | PRN
  Administered 2018-09-04: 266 mg

## 2018-09-04 MED ORDER — LIDOCAINE HCL (CARDIAC) PF 100 MG/5ML IV SOSY
PREFILLED_SYRINGE | INTRAVENOUS | Status: DC | PRN
  Administered 2018-09-04: 40 mg via INTRATRACHEAL

## 2018-09-04 MED ORDER — FENTANYL CITRATE (PF) 100 MCG/2ML IJ SOLN
INTRAMUSCULAR | Status: DC | PRN
  Administered 2018-09-04: 100 ug via INTRAVENOUS

## 2018-09-04 MED ORDER — MIDAZOLAM HCL 2 MG/2ML IJ SOLN
INTRAMUSCULAR | Status: DC | PRN
  Administered 2018-09-04: 2 mg via INTRAVENOUS

## 2018-09-04 MED ORDER — EPHEDRINE SULFATE 50 MG/ML IJ SOLN
INTRAMUSCULAR | Status: DC | PRN
  Administered 2018-09-04 (×3): 5 mg via INTRAVENOUS

## 2018-09-04 MED ORDER — ACETAMINOPHEN 160 MG/5ML PO SOLN: 325.0000 mg | ORAL | Status: DC | PRN

## 2018-09-04 MED ORDER — OXYCODONE HCL 5 MG/5ML PO SOLN: 5.0000 mg | Freq: Once | ORAL | Status: DC | PRN

## 2018-09-04 MED ORDER — DEXTROSE 5 % IV SOLN
2000.0000 mg | Freq: Once | INTRAVENOUS | Status: AC
  Administered 2018-09-04: 2000 mg via INTRAVENOUS

## 2018-09-04 MED ORDER — GLYCOPYRROLATE 0.2 MG/ML IJ SOLN
INTRAMUSCULAR | Status: DC | PRN
  Administered 2018-09-04: 0.1 mg via INTRAVENOUS

## 2018-09-04 MED ORDER — OXYCODONE HCL 5 MG PO TABS: 5.0000 mg | ORAL_TABLET | Freq: Once | ORAL | Status: DC | PRN

## 2018-09-04 MED ORDER — LACTATED RINGERS IV SOLN
INTRAVENOUS | Status: DC | PRN
  Administered 2018-09-04: 11 mL

## 2018-09-04 MED ORDER — OXYCODONE HCL 5 MG PO TABS: 5.0000 mg | ORAL_TABLET | ORAL | 0 refills | Status: DC | PRN

## 2018-09-04 SURGICAL SUPPLY — 61 items
ADAPTER IRRIG TUBE 2 SPIKE SOL (ADAPTER) ×6 IMPLANT
ANCHOR SUT FBRTK SUTURETAP 1.3 (Anchor) ×2 IMPLANT
BIT DRILL RIGD1.8MM FBRTK STRL (DRILL) IMPLANT
BUR RADIUS 4.0X18.5 (BURR) ×2 IMPLANT
BUR RADIUS 5.5 (BURR) ×3 IMPLANT
CANNULA 5.75X7 CRYSTAL CLEAR (CANNULA) ×3 IMPLANT
CHLORAPREP W/TINT 26ML (MISCELLANEOUS) ×3 IMPLANT
COOLER POLAR GLACIER W/PUMP (MISCELLANEOUS) ×3 IMPLANT
COVER LIGHT HANDLE UNIVERSAL (MISCELLANEOUS) ×6 IMPLANT
DERMABOND ADVANCED (GAUZE/BANDAGES/DRESSINGS) ×2
DERMABOND ADVANCED .7 DNX12 (GAUZE/BANDAGES/DRESSINGS) ×1 IMPLANT
DRAPE IMP U-DRAPE 54X76 (DRAPES) ×6 IMPLANT
DRAPE INCISE IOBAN 66X45 STRL (DRAPES) ×3 IMPLANT
DRAPE SHEET LG 3/4 BI-LAMINATE (DRAPES) ×3 IMPLANT
DRILL RIGID 1.8MM FBRTK STRL (DRILL) ×3
DRSG TEGADERM 4X4.75 (GAUZE/BANDAGES/DRESSINGS) ×9 IMPLANT
ELECT REM PT RETURN 9FT ADLT (ELECTROSURGICAL) ×3
ELECTRODE REM PT RTRN 9FT ADLT (ELECTROSURGICAL) ×1 IMPLANT
GAUZE PETRO XEROFOAM 1X8 (MISCELLANEOUS) ×2 IMPLANT
GAUZE SPONGE 4X4 12PLY STRL (GAUZE/BANDAGES/DRESSINGS) ×3 IMPLANT
GLOVE BIO SURGEON STRL SZ7.5 (GLOVE) ×8 IMPLANT
GLOVE BIOGEL PI IND STRL 8 (GLOVE) ×1 IMPLANT
GLOVE BIOGEL PI INDICATOR 8 (GLOVE) ×4
GOWN STRL REIN 2XL XLG LVL4 (GOWN DISPOSABLE) ×5 IMPLANT
GOWN STRL REUS W/ TWL LRG LVL3 (GOWN DISPOSABLE) ×1 IMPLANT
GOWN STRL REUS W/TWL LRG LVL3 (GOWN DISPOSABLE) ×4
GOWN STRL REUS W/TWL LRG LVL4 (GOWN DISPOSABLE) ×2 IMPLANT
IMP SYSTEM BRIDGE 4.75X19.1 (Anchor) ×3 IMPLANT
IMPL SYSTEM BRIDGE 4.75X19.1 (Anchor) IMPLANT
IV LACTATED RINGER IRRG 3000ML (IV SOLUTION) ×22
IV LR IRRIG 3000ML ARTHROMATIC (IV SOLUTION) ×8 IMPLANT
KIT SPEAR STR 1.6MM DRILL (MISCELLANEOUS) ×2 IMPLANT
KIT STABILIZATION SHOULDER (MISCELLANEOUS) ×3 IMPLANT
KIT TURNOVER KIT A (KITS) ×3 IMPLANT
MASK FACE SPIDER DISP (MASK) ×3 IMPLANT
MAT GRAY ABSORB FLUID 28X50 (MISCELLANEOUS) ×6 IMPLANT
NDL MAYO CATGUT SZ5 (NEEDLE) ×4
NDL SAFETY ECLIPSE 18X1.5 (NEEDLE) ×1 IMPLANT
NDL SCORPION MULTI FIRE (NEEDLE) IMPLANT
NDL SUT 5 .5 CRC TPR PNT MAYO (NEEDLE) IMPLANT
NEEDLE HYPO 18GX1.5 SHARP (NEEDLE) ×2
NEEDLE SCORPION MULTI FIRE (NEEDLE) ×3 IMPLANT
PACK ARTHROSCOPY SHOULDER (MISCELLANEOUS) ×3 IMPLANT
PAD WRAPON POLAR SHDR UNIV (MISCELLANEOUS) ×1 IMPLANT
PENCIL SMOKE EVACUATOR (MISCELLANEOUS) ×3 IMPLANT
SET TUBE SUCT SHAVER OUTFL 24K (TUBING) ×3 IMPLANT
SET TUBE TIP INTRA-ARTICULAR (MISCELLANEOUS) ×3 IMPLANT
SPONGE GAUZE 2X2 8PLY STER LF (GAUZE/BANDAGES/DRESSINGS) ×3
SPONGE GAUZE 2X2 8PLY STRL LF (GAUZE/BANDAGES/DRESSINGS) ×6 IMPLANT
SUT ETHILON 3-0 FS-10 30 BLK (SUTURE) ×3
SUT MNCRL 4-0 (SUTURE) ×2
SUT MNCRL 4-0 27XMFL (SUTURE) ×1
SUT VIC AB 0 CT1 36 (SUTURE) ×3 IMPLANT
SUT VIC AB 2-0 CT2 27 (SUTURE) ×2 IMPLANT
SUTURE EHLN 3-0 FS-10 30 BLK (SUTURE) ×1 IMPLANT
SUTURE MNCRL 4-0 27XMF (SUTURE) ×1 IMPLANT
SYR 10ML LL (SYRINGE) ×3 IMPLANT
TAPE MICROFOAM 4IN (TAPE) ×2 IMPLANT
TUBING ARTHRO INFLOW-ONLY STRL (TUBING) ×3 IMPLANT
WAND HAND CNTRL MULTIVAC 90 (MISCELLANEOUS) ×3 IMPLANT
WRAPON POLAR PAD SHDR UNIV (MISCELLANEOUS) ×3

## 2018-09-04 NOTE — Anesthesia Preprocedure Evaluation (Addendum)
Anesthesia Evaluation  Patient identified by MRN, date of birth, ID band Patient awake    Reviewed: Allergy & Precautions, H&P , NPO status , Patient's Chart, lab work & pertinent test results, reviewed documented beta blocker date and time   Airway Mallampati: II  TM Distance: >3 FB Neck ROM: full    Dental no notable dental hx.    Pulmonary neg pulmonary ROS,    Pulmonary exam normal breath sounds clear to auscultation       Cardiovascular Exercise Tolerance: Good hypertension,  Rhythm:regular Rate:Normal     Neuro/Psych negative neurological ROS  negative psych ROS   GI/Hepatic negative GI ROS, Neg liver ROS,   Endo/Other  negative endocrine ROS  Renal/GU negative Renal ROS  negative genitourinary   Musculoskeletal   Abdominal   Peds  Hematology negative hematology ROS (+)   Anesthesia Other Findings   Reproductive/Obstetrics negative OB ROS                             Anesthesia Physical Anesthesia Plan  ASA: II  Anesthesia Plan: General and Regional   Post-op Pain Management:    Induction:   PONV Risk Score and Plan: 2 and Ondansetron, Dexamethasone and Midazolam  Airway Management Planned:   Additional Equipment:   Intra-op Plan:   Post-operative Plan:   Informed Consent: I have reviewed the patients History and Physical, chart, labs and discussed the procedure including the risks, benefits and alternatives for the proposed anesthesia with the patient or authorized representative who has indicated his/her understanding and acceptance.     Plan Discussed with: CRNA  Anesthesia Plan Comments:        Anesthesia Quick Evaluation

## 2018-09-04 NOTE — Anesthesia Postprocedure Evaluation (Signed)
Anesthesia Post Note  Patient: Laurie Ryan  Procedure(s) Performed: ARTHROSCOPY SHOULDER (Right Shoulder) MINI OPEN ROTATOR CUFF REPAIR (Right Shoulder) BICEPS TENODESIS (Right Shoulder) SHOULDER ARTHROSCOPY WITH SUBACROMIAL DECOMPRESSION AND DISTAL CLAVICLE EXCISION (Right Shoulder)  Patient location during evaluation: PACU Anesthesia Type: Regional and General Level of consciousness: awake and alert Pain management: pain level controlled Vital Signs Assessment: post-procedure vital signs reviewed and stable Respiratory status: spontaneous breathing, nonlabored ventilation, respiratory function stable and patient connected to nasal cannula oxygen Cardiovascular status: blood pressure returned to baseline and stable Postop Assessment: no apparent nausea or vomiting Anesthetic complications: no    Jaquelyn Sakamoto D Iliya Spivack

## 2018-09-04 NOTE — Anesthesia Procedure Notes (Signed)
Anesthesia Regional Block: Interscalene brachial plexus block   Pre-Anesthetic Checklist: ,, timeout performed, Correct Patient, Correct Site, Correct Laterality, Correct Procedure, Correct Position, site marked, Risks and benefits discussed,  Surgical consent,  Pre-op evaluation,  At surgeon's request and post-op pain management  Laterality: Right  Prep: chloraprep       Needles:  Injection technique: Single-shot  Needle Type: Stimiplex     Needle Length: 10cm  Needle Gauge: 21     Additional Needles:   Procedures:,,,, ultrasound used (permanent image in chart),,,,  Narrative:  Start time: 09/04/2018 12:06 PM End time: 09/04/2018 12:13 PM Injection made incrementally with aspirations every 5 mL.  Performed by: Personally  Anesthesiologist: Esmeralda Links, MD  Additional Notes: Functioning IV was confirmed and monitors applied. Ultrasound guidance: relevant anatomy identified, needle position confirmed, local anesthetic spread visualized around nerve(s)., vascular puncture avoided.  Image printed for medical record.  Negative aspiration and no paresthesias; incremental administration of local anesthetic. The patient tolerated the procedure well. Vitals signes recorded in RN notes.

## 2018-09-04 NOTE — Discharge Instructions (Signed)
Post-Op Instructions - Rotator Cuff Repair  1. Bracing: You will wear a shoulder immobilizer or sling for 6 weeks.   2. Driving: No driving for 3 weeks post-op. When driving, do not wear the immobilizer. Ideally, we recommend no driving for 6 weeks while sling is in place as one arm will be immobilized.   3. Activity: No active lifting for 2 months. Wrist, hand, and elbow motion only. Avoid lifting the upper arm away from the body except for hygiene. You are permitted to bend and straighten the elbow passively only (no active elbow motion). You may use your hand and wrist for typing, writing, and managing utensils (cutting food). Do not lift more than a coffee cup for 8 weeks.  When sleeping or resting, inclined positions (recliner chair or wedge pillow) and a pillow under the forearm for support may provide better comfort for up to 4 weeks.  Avoid long distance travel for 4 weeks.  Return to normal activities after rotator cuff repair repair normally takes 6 months on average. If rehab goes very well, may be able to do most activities at 4 months, except overhead or contact sports.  4. Physical Therapy: Begins 3-4 days after surgery, and proceed 1 time per week for the first 6 weeks, then 1-2 times per week from weeks 6-20 post-op.  5. Medications:  - You will be provided a prescription for narcotic pain medicine. After surgery, take 1-2 narcotic tablets every 4 hours if needed for severe pain.  - A prescription for anti-nausea medication will be provided in case the narcotic medicine causes nausea - take 1 tablet every 6 hours only if nauseated.   - Take tylenol 1000 mg (2 Extra Strength tablets or 3 regular strength) every 8 hours for pain.  May decrease or stop tylenol 5 days after surgery if you are having minimal pain. - Take ASA 325mg/day x 2 weeks to help prevent DVTs/PEs (blood clots).  - DO NOT take ANY nonsteroidal anti-inflammatory pain medications (Advil, Motrin, Ibuprofen, Aleve,  Naproxen, or Naprosyn). These medicines can inhibit healing of your shoulder repair.    If you are taking prescription medication for anxiety, depression, insomnia, muscle spasm, chronic pain, or for attention deficit disorder, you are advised that you are at a higher risk of adverse effects with use of narcotics post-op, including narcotic addiction/dependence, depressed breathing, death. If you use non-prescribed substances: alcohol, marijuana, cocaine, heroin, methamphetamines, etc., you are at a higher risk of adverse effects with use of narcotics post-op, including narcotic addiction/dependence, depressed breathing, death. You are advised that taking > 50 morphine milligram equivalents (MME) of narcotic pain medication per day results in twice the risk of overdose or death. For your prescription provided: oxycodone 5 mg - taking more than 6 tablets per day would result in > 50 morphine milligram equivalents (MME) of narcotic pain medication. Be advised that we will prescribe narcotics short-term, for acute post-operative pain only - 3 weeks for major operations such as shoulder repair/reconstruction surgeries.     6. Post-Op Appointment:  Your first post-op appointment will be 10-14 days post-op.  7. Work or School: For most, but not all procedures, we advise staying out of work or school for at least 1 to 2 weeks in order to recover from the stress of surgery and to allow time for healing.   If you need a work or school note this can be provided.   8. Smoking: If you are a smoker, you need to refrain from   smoking in the postoperative period. The nicotine in cigarettes will inhibit healing of your shoulder repair and decrease the chance of successful repair. Similarly, nicotine containing products (gum, patches) should be avoided.  ° °Post-operative Brace: °Apply and remove the brace you received as you were instructed to at the time of fitting and as described in detail as the brace’s  instructions for use indicate.  Wear the brace for the period of time prescribed by your physician.  The brace can be cleaned with soap and water and allowed to air dry only.  Should the brace result in increased pain, decreased feeling (numbness/tingling), increased swelling or an overall worsening of your medical condition, please contact your doctor immediately.  If an emergency situation occurs as a result of wearing the brace after normal business hours, please dial 911 and seek immediate medical attention.  Let your doctor know if you have any further questions about the brace issued to you. °Refer to the shoulder sling instructions for use if you have any questions regarding the correct fit of your shoulder sling.  °[facility] Customer Care for Troubleshooting: [phone] ° °Video that illustrates how to properly use a shoulder sling: °"Instructions for Proper Use of an Orthopaedic Sling" °https://www.youtube.com/watch?v=AHZpn_Xo45w ° ° ° ° ° ° ° °General Anesthesia, Adult, Care After °These instructions provide you with information about caring for yourself after your procedure. Your health care provider may also give you more specific instructions. Your treatment has been planned according to current medical practices, but problems sometimes occur. Call your health care provider if you have any problems or questions after your procedure. °What can I expect after the procedure? °After the procedure, it is common to have: °· Vomiting. °· A sore throat. °· Mental slowness. ° °It is common to feel: °· Nauseous. °· Cold or shivery. °· Sleepy. °· Tired. °· Sore or achy, even in parts of your body where you did not have surgery. ° °Follow these instructions at home: °For at least 24 hours after the procedure: °· Do not: °? Participate in activities where you could fall or become injured. °? Drive. °? Use heavy machinery. °? Drink alcohol. °? Take sleeping pills or medicines that cause drowsiness. °? Make important  decisions or sign legal documents. °? Take care of children on your own. °· Rest. °Eating and drinking °· If you vomit, drink water, juice, or soup when you can drink without vomiting. °· Drink enough fluid to keep your urine clear or pale yellow. °· Make sure you have little or no nausea before eating solid foods. °· Follow the diet recommended by your health care provider. °General instructions °· Have a responsible adult stay with you until you are awake and alert. °· Return to your normal activities as told by your health care provider. Ask your health care provider what activities are safe for you. °· Take over-the-counter and prescription medicines only as told by your health care provider. °· If you smoke, do not smoke without supervision. °· Keep all follow-up visits as told by your health care provider. This is important. °Contact a health care provider if: °· You continue to have nausea or vomiting at home, and medicines are not helpful. °· You cannot drink fluids or start eating again. °· You cannot urinate after 8-12 hours. °· You develop a skin rash. °· You have fever. °· You have increasing redness at the site of your procedure. °Get help right away if: °· You have difficulty breathing. °· You have chest   pain.  You have unexpected bleeding.  You feel that you are having a life-threatening or urgent problem. This information is not intended to replace advice given to you by your health care provider. Make sure you discuss any questions you have with your health care provider. Document Released: 01/30/2001 Document Revised: 03/28/2016 Document Reviewed: 10/08/2015 Elsevier Interactive Patient Education  2018 [facility] for Discharge Teaching: EXPAREL (bupivacaine liposome injectable suspension)   Your surgeon or anesthesiologist gave you EXPAREL(bupivacaine) to help control your pain after surgery.   EXPAREL is a local anesthetic that provides pain relief by numbing the  tissue around the surgical site.  EXPAREL is designed to release pain medication over time and can control pain for up to 72 hours.  Depending on how you respond to EXPAREL, you may require less pain medication during your recovery.  Possible side effects:  Temporary loss of sensation or ability to move in the area where bupivacaine was injected.  Nausea, vomiting, constipation  Rarely, numbness and tingling in your mouth or lips, lightheadedness, or anxiety may occur.  Call your doctor right away if you think you may be experiencing any of these sensations, or if you have other questions regarding possible side effects.  Follow all other discharge instructions given to you by your surgeon or nurse. Eat a healthy diet and drink plenty of water or other fluids.  If you return to the hospital for any reason within 96 hours following the administration of EXPAREL, it is important for health care providers to know that you have received this anesthetic. A teal colored band has been placed on your arm with the date, time and amount of EXPAREL you have received in order to alert and inform your health care providers. Please leave this armband in place for the full 96 hours following administration, and then you may remove the band.

## 2018-09-04 NOTE — Transfer of Care (Signed)
Immediate Anesthesia Transfer of Care Note  Patient: [name]  Procedure(s) Performed: ARTHROSCOPY SHOULDER (Right Shoulder) MINI OPEN ROTATOR CUFF REPAIR (Right Shoulder) BICEPS TENODESIS (Right Shoulder) SHOULDER ARTHROSCOPY WITH SUBACROMIAL DECOMPRESSION AND DISTAL CLAVICLE EXCISION (Right Shoulder)  Patient Location: PACU  Anesthesia Type: General, Regional  Level of Consciousness: awake, alert  and patient cooperative  Airway and Oxygen Therapy: Patient Spontanous Breathing and Patient connected to supplemental oxygen  Post-op Assessment: Post-op Vital signs reviewed, Patient's Cardiovascular Status Stable, Respiratory Function Stable, Patent Airway and No signs of Nausea or vomiting  Post-op Vital Signs: Reviewed and stable  Complications: No apparent anesthesia complications

## 2018-09-04 NOTE — Progress Notes (Signed)
Assisted Marchia Bond ANMD with right, ultrasound guided, interscalene  block. Side rails up, monitors on throughout procedure. See vital signs in flow sheet. Tolerated Procedure well.

## 2018-09-04 NOTE — Anesthesia Procedure Notes (Signed)
Procedure Name: LMA Insertion Date/Time: 09/04/2018 1:01 PM Performed by: Mayme Genta, CRNA Pre-anesthesia Checklist: Patient identified, Emergency Drugs available, Suction available, Timeout performed and Patient being monitored Patient Re-evaluated:Patient Re-evaluated prior to induction Oxygen Delivery Method: Circle system utilized Preoxygenation: Pre-oxygenation with 100% oxygen Induction Type: IV induction LMA: LMA inserted LMA Size: 4.0 Number of attempts: 1 Placement Confirmation: positive ETCO2 and breath sounds checked- equal and bilateral Tube secured with: Tape

## 2018-09-04 NOTE — Op Note (Signed)
SURGERY DATE: 09/04/2018  PRE-OP DIAGNOSIS:  1. Right subacromial impingement 2. Right biceps tendinopathy 3. Right rotator cuff tear 4. Right acromioclavicular joint osteoarthritis  POST-OP DIAGNOSIS: 1. Right subacromial impingement 2. Right biceps tendinopathy 3. Right rotator cuff tear 4. Right acromioclavicular joint osteoarthritis  PROCEDURES:  1. Right mini-open rotator cuff repair 2. Right open biceps tenodesis 3. Right arthroscopic distal clavicle excision 4. Right arthroscopic extensive debridement of shoulder (glenohumeral and subacromial spaces) 5. Right arthroscopic subacromial decompression  SURGEON: Cato Mulligan, MD  ANESTHESIA: Gen with Exparil interscalene block  ESTIMATED BLOOD LOSS: 25cc  DRAINS:  none  TOTAL IV FLUIDS: per anesthesia   SPECIMENS: none  IMPLANTS:  - Arthrex 4.34m SwiveLock x 4 (SpeedBridge Kit) - Arthrex 1.897mFiberTak   OPERATIVE FINDINGS:  Examination under anesthesia: A careful examination under anesthesia was performed.  Passive range of motion was: FF: 160; ER at side: 45; ER in abduction: 90; IR in abduction: 50.  Anterior load shift: NT.  Posterior load shift: NT.  Sulcus in neutral: NT.  Sulcus in ER: NT.    Intra-operative findings: A thorough arthroscopic examination of the shoulder was performed.  The findings are: 1. Biceps tendon: tendinopathy with significant erythema of the intraarticular portion 2. Superior labrum: injected with surrounding synovitis 3. Posterior labrum and capsule: normal 4. Inferior capsule and inferior recess: normal 5. Glenoid cartilage surface: Grade 1 changes with mild surface fibrillation posteriorly and centrally 6. Supraspinatus attachment: full-thickness tear with delamination of tendon laterally 7. Posterior rotator cuff attachment: partial thickness articular sided tear measuring <50% of footprint 8. Humeral head articular cartilage: normal 9. Rotator interval: significant  synovitis 10: Subscapularis tendon: attachment intact 11. Anterior labrum: degenerative 12. IGHL: normal  OPERATIVE REPORT:   Indications for procedure: Laurie FARAONEs a [age].o. female with ~3 months of L shoulder pain that has failed non-operative management including activity modification, physical therapy, and medical management. Clinical exam and MRI were suggestive of full-thickness rotator cuff tear, biceps tendinopathy, subacromial impingement, and acromioclavicular joint arthritis. After discussion of risks, benefits, and alternatives to surgery, the patient elected to proceed.   Procedure in detail:  I identified Laurie OHLINGERn the pre-operative holding area.  I marked the operative shoulder with my initials. I reviewed the risks and benefits of the proposed surgical intervention, and the patient (and/or patient's guardian) wished to proceed.  Anesthesia was then performed with an Exparil interscalene block.  The patient was transferred to the operative suite and placed in the beach chair position.    SCDs were placed on the lower extremities. Appropriate IV antibiotics were administered prior to incision. The operative upper extremity was then prepped and draped in standard fashion. A time out was performed confirming the correct extremity, correct patient, and correct procedure.   I then created a standard posterior portal with an 11 blade. The glenohumeral joint was easily entered with a blunt trochar and the arthroscope introduced. The findings of diagnostic arthroscopy are described above. I debrided degenerative tissue including the synovitic tissue about the rotator interval and superior labrum. I then coagulated the inflamed synovium to obtain hemostasis and reduce the risk of post-operative swelling using an Arthrocare radiofrequency device. I performed a biceps tenotomy using an arthroscopic scissors and used a motorized shaver to debride the stump back to a stable base.    Next, the arthroscope was then introduced into the subacromial space. A direct lateral portal was created with an 11-blade after spinal needle localization. An  extensive subacromial bursectomy was performed using a combination of the shaver and Arthrocare wand. The entire acromial undersurface was exposed and the CA ligament was subperiosteally elevated to expose the anterior acromial hook. A 5.53m barrel burr was used to create a flat anterior and lateral aspect of the acromion, converting it from a Type 2 to a Type 1 acromion. Care was made to keep the deltoid fascia intact.  I then turned my attention to the arthroscopic distal clavicle excision. I identified the acromioclavicular joint. Surrounding bursal tissue was debrided and the edges of the joint were identified. I used the 5.556mbarrel burr to remove the distal clavicle parallel to the edge of the acromion. I was able to fit two widths of the burr into the space between the distal clavicle and acromion, signifying that I had removed ~1167mf distal clavicle. This was confirmed by viewing anteriorly and introducing a probe with measuring marks from the lateral portal.  Fluid was evacuated from the shoulder.   A longitudinal incision from the anterolateral acromion ~7cm in length was made overlying the raphe between the anterior and middle heads of the deltoid. The raphe was identified and it was incised. The subacromial space was identified. Any remaining bursa was excised. The rotator cuff tear was identified. It was an U-shaped tear with delamination of the supraspinatus to the articular margin.   We then turned our attention to the biceps tenodesis. The arm was externally rotated.  The bicipital groove was identified.  A 15 blade was used to make a cut overlying the biceps tendon, and the tendon was removed using a right angle clamp.  The base of the bicipital groove was identified and cleared of soft tissue.  A FiberTak anchor was placed in  the bicipital groove.  The biceps tendon was held at the appropriate amount of tension.  One set of sutures was passed through the biceps anchor with one limb passed in a simple fashion and the second limb passed in a simple plus locking stitch pattern.  This was repeated for the other set of sutures.  This construct allowed for shuttling the biceps tendon down to the bone.  The sutures were tied and cut.  The diseased portion of the proximal biceps was then excised.  The arm was then internally rotated. Unhealthy appearing supraspinatus tendon was excised sharply with a 15 blade.  The rotator cuff footprint was cleared of soft tissue and a bleeding bed was achieved by using a rongeur to prepare the footprint.  Two Arthrex 4.75 medial row anchors from the SpeBurleyt were placed just lateral to the articular margin. Both strands of suture were passed appropriately through both superior and inferior portions of the supraspinatus while holding it in a reduced position. Two SwiveLock anchors were placed for the lateral row anchors with one limb of each of the medial row sutures passed through each anchor. This allowed for excellent reapproximation and compression of the rotator cuff over its footprint. The construct was stable with external and internal rotation.  The wound was thoroughly irrigated.  The deltoid split was closed with 0 Vicryl.  The subdermal layer was closed with 2-0 Vicryl.  The skin was closed with 4-0 Monocryl and Dermabond. The portals were closed with 3-0 Nylon. Xeroform was applied to the incisions. A sterile dressing was applied, followed by a Polar Care sleeve and a SlingShot shoulder immobilizer/sling. The patient was awakened from anesthesia without difficulty and was transferred to the PACU in stable condition.  COMPLICATIONS: none  DISPOSITION: plan for discharge home after recovery in PACU   POSTOPERATIVE PLAN: Remain in sling (except hygiene and elbow/wrist/hand RoM  exercises as instructed by PT) x 6 weeks and NWB for this time. PT to begin 3-4 days after surgery. Rotator cuff repair and biceps tenodesis rehab protocol. ASA 383m daily x 2 weeks for DVT ppx.

## 2018-09-04 NOTE — H&P (Signed)
Paper H&P to be scanned into permanent record. H&P reviewed. No significant changes noted.  

## 2018-09-05 ENCOUNTER — Encounter: Payer: Self-pay | Admitting: Orthopedic Surgery

## 2018-11-28 ENCOUNTER — Ambulatory Visit
Admission: RE | Admit: 2018-11-28 | Discharge: 2018-11-28 | Disposition: A | Discharge status: Home / Self Care | Payer: BLUE CROSS/BLUE SHIELD | Source: Ambulatory Visit | Attending: Family Medicine | Admitting: Family Medicine

## 2018-11-28 DIAGNOSIS — Z1231 Encounter for screening mammogram for malignant neoplasm of breast: Secondary | ICD-10-CM | POA: Diagnosis present

## 2019-01-17 ENCOUNTER — Other Ambulatory Visit: Payer: Self-pay

## 2019-01-17 ENCOUNTER — Emergency Department: Payer: BLUE CROSS/BLUE SHIELD

## 2019-01-17 ENCOUNTER — Emergency Department
Admission: EM | Admit: 2019-01-17 | Discharge: 2019-01-17 | Disposition: A | Discharge status: Home / Self Care | Payer: BLUE CROSS/BLUE SHIELD | Attending: Emergency Medicine | Admitting: Emergency Medicine

## 2019-01-17 DIAGNOSIS — Y93I9 Activity, other involving external motion: Secondary | ICD-10-CM | POA: Insufficient documentation

## 2019-01-17 DIAGNOSIS — S3992XA Unspecified injury of lower back, initial encounter: Secondary | ICD-10-CM | POA: Insufficient documentation

## 2019-01-17 DIAGNOSIS — M7918 Myalgia, other site: Secondary | ICD-10-CM

## 2019-01-17 DIAGNOSIS — I10 Essential (primary) hypertension: Secondary | ICD-10-CM | POA: Diagnosis not present

## 2019-01-17 DIAGNOSIS — S199XXA Unspecified injury of neck, initial encounter: Secondary | ICD-10-CM | POA: Insufficient documentation

## 2019-01-17 DIAGNOSIS — Y92410 Unspecified street and highway as the place of occurrence of the external cause: Secondary | ICD-10-CM | POA: Diagnosis not present

## 2019-01-17 DIAGNOSIS — Y998 Other external cause status: Secondary | ICD-10-CM | POA: Diagnosis not present

## 2019-01-17 MED ORDER — LIDOCAINE 5 % EX PTCH
1.0000 | MEDICATED_PATCH | CUTANEOUS | Status: DC
  Administered 2019-01-17: 1 via TRANSDERMAL
  Filled 2019-01-17: qty 1

## 2019-01-17 MED ORDER — IBUPROFEN 600 MG PO TABS: 600.0000 mg | ORAL_TABLET | Freq: Three times a day (TID) | ORAL | 0 refills | Status: DC | PRN

## 2019-01-17 MED ORDER — CYCLOBENZAPRINE HCL 10 MG PO TABS: 10.0000 mg | ORAL_TABLET | Freq: Three times a day (TID) | ORAL | 0 refills | Status: DC | PRN
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
## 2019-04-22 ENCOUNTER — Encounter: Payer: Self-pay | Admitting: Podiatry

## 2019-04-22 ENCOUNTER — Ambulatory Visit (INDEPENDENT_AMBULATORY_CARE_PROVIDER_SITE_OTHER): Payer: BC Managed Care – PPO | Admitting: Podiatry

## 2019-04-22 ENCOUNTER — Other Ambulatory Visit: Payer: Self-pay

## 2019-04-22 DIAGNOSIS — Q828 Other specified congenital malformations of skin: Secondary | ICD-10-CM

## 2019-04-22 DIAGNOSIS — F329 Major depressive disorder, single episode, unspecified: Secondary | ICD-10-CM | POA: Insufficient documentation

## 2019-04-22 DIAGNOSIS — M216X9 Other acquired deformities of unspecified foot: Secondary | ICD-10-CM | POA: Diagnosis not present

## 2019-04-22 DIAGNOSIS — M205X1 Other deformities of toe(s) (acquired), right foot: Secondary | ICD-10-CM | POA: Diagnosis not present

## 2019-04-22 DIAGNOSIS — I10 Essential (primary) hypertension: Secondary | ICD-10-CM | POA: Insufficient documentation

## 2019-04-22 DIAGNOSIS — F32A Depression, unspecified: Secondary | ICD-10-CM | POA: Insufficient documentation

## 2019-04-22 NOTE — Progress Notes (Signed)
This patient presents to the office for multiple painful calluses on the ball of both feet and pain in the calf muscle on her left leg.  She says that the calluses become thick and painful walking and wearing her shoes.  She says she has a job that requires significant standing and walking.  She states that the calluses become thick and painful making walking more difficult.  She says that the callus under the outside ball of her left foot is most painful and causes her to walk in such a manner to relieve her pain.  She presents the office today for an evaluation and treatment of her feet  Vascular  Dorsalis pedis and posterior tibial pulses are palpable  B/L.  Capillary return  WNL.  Temperature gradient is  WNL.  Skin turgor  WNL  Sensorium  Senn Weinstein monofilament wire  WNL. Normal tactile sensation.  Nail Exam  Patient has normal nails with no evidence of bacterial or fungal infection.  Orthopedic  Exam  Muscle tone and muscle strength  WNL.  No limitations of motion feet  B/L.  No crepitus or joint effusion noted.  Foot type is unremarkable and digits show no abnormalities.  FHL 1st MPJ  B/L.  Plantar flexed fifth metatarsals  B/l.  Skin  No open lesions.  Normal skin texture and turgor.  Callus sub 1,5  B/l.  Callus secondary plantar flexed fifth met  B/L.  FHL  B/L.    IE.  Discussed this condition with this patient.  I believe she is developed pain in her calf and lower back due to compensatory gait due to the painful callus sub-fifth left foot.  She has a hallux limitus first MPJ  right greater than the left.  Recommend that she start with power step insoles to be worn in her work shoes.  Told her to use stiff soled shoes at work.  Patient should return to the office in 2 weeks for reevaluation and possible modification of the power step insole that she is wearing in her shoes.   Gardiner Barefoot DPM

## 2019-05-06 ENCOUNTER — Ambulatory Visit: Payer: Medicare Other | Admitting: Podiatry

## 2019-05-23 ENCOUNTER — Ambulatory Visit: Payer: Medicare Other | Admitting: Podiatry

## 2019-05-29 ENCOUNTER — Other Ambulatory Visit (HOSPITAL_COMMUNITY): Payer: Self-pay | Admitting: Orthopedic Surgery

## 2019-05-29 ENCOUNTER — Ambulatory Visit (HOSPITAL_COMMUNITY)
Admission: RE | Admit: 2019-05-29 | Discharge: 2019-05-29 | Disposition: A | Discharge status: Home / Self Care | Payer: BC Managed Care – PPO | Source: Ambulatory Visit | Attending: Orthopedic Surgery | Admitting: Orthopedic Surgery

## 2019-05-29 ENCOUNTER — Other Ambulatory Visit: Payer: Self-pay

## 2019-05-29 DIAGNOSIS — M79605 Pain in left leg: Secondary | ICD-10-CM

## 2019-05-29 DIAGNOSIS — M7989 Other specified soft tissue disorders: Secondary | ICD-10-CM | POA: Insufficient documentation

## 2019-05-29 NOTE — Progress Notes (Signed)
  LLE venous duplex       has been completed. Preliminary results can be found under CV proc through chart review. June Leap, BS, RDMS, RVT    Called results to Dr. Mardelle Matte

## 2019-06-24 ENCOUNTER — Ambulatory Visit: Payer: BC Managed Care – PPO | Admitting: Podiatry

## 2019-08-08 ENCOUNTER — Other Ambulatory Visit: Payer: Self-pay

## 2019-08-08 DIAGNOSIS — I83899 Varicose veins of unspecified lower extremities with other complications: Secondary | ICD-10-CM

## 2019-08-14 ENCOUNTER — Inpatient Hospital Stay (HOSPITAL_COMMUNITY): Admission: RE | Admit: 2019-08-14 | Payer: BC Managed Care – PPO | Source: Ambulatory Visit

## 2019-08-14 ENCOUNTER — Encounter: Payer: BC Managed Care – PPO | Admitting: Vascular Surgery

## 2019-08-19 ENCOUNTER — Ambulatory Visit (INDEPENDENT_AMBULATORY_CARE_PROVIDER_SITE_OTHER): Payer: BC Managed Care – PPO | Admitting: Physician Assistant

## 2019-08-19 ENCOUNTER — Ambulatory Visit (HOSPITAL_COMMUNITY)
Admission: RE | Admit: 2019-08-19 | Discharge: 2019-08-19 | Disposition: A | Discharge status: Home / Self Care | Payer: BC Managed Care – PPO | Source: Ambulatory Visit | Attending: Vascular Surgery | Admitting: Vascular Surgery

## 2019-08-19 ENCOUNTER — Other Ambulatory Visit: Payer: Self-pay

## 2019-08-19 VITALS — BP 130/78 | HR 79 | Temp 97.9°F | Resp 14 | Ht 66.0 in | Wt 143.3 lb

## 2019-08-19 DIAGNOSIS — I872 Venous insufficiency (chronic) (peripheral): Secondary | ICD-10-CM

## 2019-08-19 DIAGNOSIS — I83892 Varicose veins of left lower extremities with other complications: Secondary | ICD-10-CM

## 2019-08-19 DIAGNOSIS — I83899 Varicose veins of unspecified lower extremities with other complications: Secondary | ICD-10-CM

## 2019-08-19 NOTE — Progress Notes (Addendum)
[facility] [place]   Reason for referral: Swollen/painful left leg  History of Present Illness  Laurie Ryan is a 68 y.o. female who presents with chief complaint: swollen leg.  Patient notes, onset of increased calf pain with a palpable "knot" pain and mild swelling was 2 months ago, associated with activity of walking at work constantly.  She has had left LE edema and pain for > 9 months total not related to injury .  The patient has had no history of DVT, no history of varicose vein, no history of venous stasis ulcers, no history of  Lymphedema and no history of skin changes in lower legs other than telangiectasis/spider veins B LE.Marland Kitchen  There is no family history of venous disorders.  The patient has not used compression stockings in the past.  She had surgery on the left LE as an infant and has 2 scars medial tibial area and medial ankle area.  She denise symptoms of claudication.  Past medical history: HTN and depression.    Past Medical History:  Diagnosis Date  . Arthritis    lower back, shoulders  . Cancer ([place])    skin ca  . Depression   . Hypertension     Past Surgical History:  Procedure Laterality Date  . BICEPT TENODESIS Right 09/04/2018   Procedure: BICEPS TENODESIS;  Surgeon: Leim Fabry, MD;  Location: [place];  Service: Orthopedics;  Laterality: Right;  . CHOLECYSTECTOMY    . COLONOSCOPY WITH PROPOFOL N/A 05/15/2015   Procedure: COLONOSCOPY WITH PROPOFOL;  Surgeon: Manya Silvas, MD;  Location: [facility] ENDOSCOPY;  Service: Endoscopy;  Laterality: N/A;  . FOOT SURGERY    . SHOULDER ARTHROSCOPY Right 09/04/2018   Procedure: ARTHROSCOPY SHOULDER;  Surgeon: Leim Fabry, MD;  Location: [place];  Service: Orthopedics;  Laterality: Right;  . SHOULDER OPEN ROTATOR CUFF REPAIR Right 09/04/2018   Procedure: MINI OPEN ROTATOR CUFF REPAIR;  Surgeon: Leim Fabry, MD;  Location: [place];  Service: Orthopedics;   Laterality: Right;  . TONSILLECTOMY      Social History   Socioeconomic History  . Marital status: Married    Spouse name: Not on file  . Number of children: Not on file  . Years of education: Not on file  . Highest education level: Not on file  Occupational History  . Not on file  Social Needs  . Financial resource strain: Not on file  . Food insecurity    Worry: Not on file    Inability: Not on file  . Transportation needs    Medical: Not on file    Non-medical: Not on file  Tobacco Use  . Smoking status: Never Smoker  . Smokeless tobacco: Never Used  Substance and Sexual Activity  . Alcohol use: No  . Drug use: No  . Sexual activity: Not on file  Lifestyle  . Physical activity    Days per week: Not on file    Minutes per session: Not on file  . Stress: Not on file  Relationships  . Social Herbalist on phone: Not on file    Gets together: Not on file    Attends religious service: Not on file    Active member of club or organization: Not on file    Attends meetings of clubs or organizations: Not on file    Relationship status: Not on file  . Intimate partner violence    Fear of current or ex  partner: Not on file    Emotionally abused: Not on file    Physically abused: Not on file    Forced sexual activity: Not on file  Other Topics Concern  . Not on file  Social History Narrative  . Not on file    Family History  Problem Relation Age of Onset  . Heart disease Mother   . Heart disease Brother   . Breast cancer Neg Hx     Current Outpatient Medications on File Prior to Visit  Medication Sig Dispense Refill  . acetaminophen (TYLENOL) 500 MG tablet Take 2 tablets (1,000 mg total) by mouth every 8 (eight) hours. 90 tablet 2  . albuterol (PROVENTIL HFA;VENTOLIN HFA) 108 (90 Base) MCG/ACT inhaler Inhale 2 puffs into the lungs every 4 (four) hours as needed for wheezing or shortness of breath. 1 Inhaler 0  . aspirin EC 81 MG tablet Take by mouth.     . benazepril (LOTENSIN) 5 MG tablet Take 5 mg by mouth daily.    Marland Kitchen escitalopram (LEXAPRO) 10 MG tablet Take 10 mg by mouth daily.    Marland Kitchen estradiol (ESTRACE) 0.1 MG/GM vaginal cream Place 1 Applicatorful vaginally at bedtime.    . fluticasone (FLONASE) 50 MCG/ACT nasal spray Place 2 sprays into both nostrils daily.    . hydrochlorothiazide (HYDRODIURIL) 25 MG tablet Take 25 mg by mouth daily.    Marland Kitchen ibuprofen (ADVIL,MOTRIN) 600 MG tablet Take 1 tablet (600 mg total) by mouth every 8 (eight) hours as needed. 15 tablet 0  . Multiple Vitamin (MULTIVITAMIN) capsule Take 1 capsule by mouth daily.    Marland Kitchen omeprazole (PRILOSEC) 20 MG capsule Take 20 mg by mouth daily.     No current facility-administered medications on file prior to visit.     Allergies as of 08/19/2019 - Review Complete 08/19/2019  Allergen Reaction Noted  . Codeine Other (See Comments) 05/15/2015     ROS:   General:  No weight loss, Fever, chills  HEENT: No recent headaches, no nasal bleeding, no visual changes, no sore throat  Neurologic: No dizziness, blackouts, seizures. No recent symptoms of stroke or mini- stroke. No recent episodes of slurred speech, or temporary blindness.  Cardiac: No recent episodes of chest pain/pressure, no shortness of breath at rest.  No shortness of breath with exertion.  Denies history of atrial fibrillation or irregular heartbeat  Vascular: No history of rest pain in feet.  No history of claudication.  No history of non-healing ulcer, No history of DVT   Pulmonary: No home oxygen, no productive cough, no hemoptysis,  No asthma or wheezing  Musculoskeletal:  [ ]  Arthritis, [ ]  Low back pain,  [ ]  Joint pain  Hematologic:No history of hypercoagulable state.  No history of easy bleeding.  No history of anemia  Gastrointestinal: No hematochezia or melena,  No gastroesophageal reflux, no trouble swallowing  Urinary: [ ]  chronic Kidney disease, [ ]  on HD - [ ]  MWF or [ ]  TTHS, [ ]  Burning with  urination, [ ]  Frequent urination, [ ]  Difficulty urinating;   Skin: No rashes  Psychological: No history of anxiety,  + history of depression  Physical Examination  Vitals:   08/19/19 1227  BP: 130/78  Pulse: 79  Resp: 14  Temp: 97.9 F (36.6 C)  TempSrc: Temporal  SpO2: 98%  Weight: 143 lb 4.8 oz (65 kg)  Height: 5\' 6"  (1.676 m)    Body mass index is 23.13 kg/m.  General:  Alert and oriented,  no acute distress HEENT: Normal Neck: No bruit or JVD Pulmonary: Clear to auscultation bilaterally Cardiac: Regular Rate and Rhythm without murmur Abdomen: Soft, non-tender, non-distended, no mass, no scars Skin: No rash      Extremity Pulses:  2+ radial, brachial, femoral, dorsalis pedis  pulses bilaterally Musculoskeletal: No deformity or minimal left LE edema, tenderness to palpation posterior left calf small saphenous vein area firm ropey vein.   Neurologic: Upper and lower extremity motor 5/5 and symmetric  DATA: Venous Reflux Times Normal value < 0.5 sec +------------------------------+----------+---------+                               Right (ms)Left (ms) +------------------------------+----------+---------+ GSV at Saphenofemoral junction          799.00    +------------------------------+----------+---------+  +------------------------------+----------+---------+ VEIN DIAMETERS:               Right (cm)Left (cm) +------------------------------+----------+---------+ GSV at Saphenofemoral junction          0.48      +------------------------------+----------+---------+ GSV at prox thigh                       0.19      +------------------------------+----------+---------+ GSV at mid thigh                        0.19      +------------------------------+----------+---------+ GSV at distal thigh                     0.17      +------------------------------+----------+---------+ SSV prox                                0.17       +------------------------------+----------+---------+       Summary: Left: No reflux was noted in the common femoral vein , femoral vein in the thigh, and popliteal vein. Abnormal reflux times were noted in the great saphenous vein at the saphenofemoral junction. Findings consistent with chronic superficial vein  thrombosis involving the left small saphenous vein. There is no evidence of deep vein thrombosis in the lower extremity.  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   Plan: I advised her to wear thigh high compression daily.  Elevation of LE's when at rest.  She will f/u in vein clinic in 3 months for laser ablation treatment consideration.    I will have her f/u with our sclero therapy RN as well for treatment of her Spider veins.  She is not at risk of limb loss.  She has palpable pedal pulses and no history of non healing wounds.    Roxy Horseman PA-C [facility] [place] Office: [phone]  MD on call Carlis Abbott

## 2019-09-17 ENCOUNTER — Telehealth: Payer: Self-pay | Admitting: *Deleted

## 2019-09-17 ENCOUNTER — Other Ambulatory Visit: Payer: Self-pay

## 2019-09-17 ENCOUNTER — Ambulatory Visit: Payer: BC Managed Care – PPO

## 2019-09-17 DIAGNOSIS — I8393 Asymptomatic varicose veins of bilateral lower extremities: Secondary | ICD-10-CM

## 2019-09-17 NOTE — Progress Notes (Signed)
Treated pt's spider veins with 1% Asclera administered with a 27g butterfly.  Patient received a total of 2 mL.Primarily treated the back left lower leg and a couple of small spider veins on front left lower leg. Pt wanted to start with 1 syringe and may return at a later date for another treatment. Anticipate good results. Pt tolerated very well. Gave post-procedure instructions both verbally and on handout. Will follow PRN.   Photos: Yes.    Compression stockings applied: Yes.

## 2019-09-17 NOTE — Telephone Encounter (Signed)
Ms. Laurie Ryan is calling in to see if her 3 month [facility]V followup appointment can be moved up. She works long shifts and leg pain makes working difficult.  She was [facility] initially at [facility] on 08-19-2019 by Gerri Lins PA and is scheduled for 3 month [facility] FU on 12-05-2019 with Dr. Scot Dock.  Explained to Ms. Laurie Ryan that insurance companies have strict guidelines and protocols regarding varicose vein treatment that [facility] follows and that she has to go through 3 months of conservative treatment before varicose vein treatment can be requested.   Encouraged her to continue wearing thigh high compression hose, elevate her legs when she is not working, take Ibuprofen 600 mg three times daily prn leg pain and use ice packs to affected areas.   Ms. Laurie Ryan verbalized understanding.

## 2019-10-08 ENCOUNTER — Encounter: Payer: Self-pay | Admitting: Vascular Surgery

## 2019-11-22 ENCOUNTER — Other Ambulatory Visit: Payer: Self-pay | Admitting: Family Medicine

## 2019-11-22 DIAGNOSIS — Z1231 Encounter for screening mammogram for malignant neoplasm of breast: Secondary | ICD-10-CM

## 2019-11-26 ENCOUNTER — Telehealth: Payer: Self-pay | Admitting: *Deleted

## 2019-11-26 NOTE — Telephone Encounter (Signed)
Returning telephone message from Laurie Ryan.  No answer. Left telephone voice message asking her to call me back.

## 2019-12-03 ENCOUNTER — Telehealth: Payer: Self-pay | Admitting: *Deleted

## 2019-12-03 NOTE — Telephone Encounter (Signed)
Unable to speak directly with Mrs. Lanius. Returning her telephone voice message from 12-02-2019 regarding appointment date and time.  Notified Mrs. Whitis that her 3 month VV follow up appointment is Thursday 12-05-2019 at 1:40 PM with Dr. Scot Dock.  Asked her to arrive 15 minutes early to register with front desk.

## 2019-12-05 ENCOUNTER — Other Ambulatory Visit: Payer: Self-pay

## 2019-12-05 ENCOUNTER — Ambulatory Visit (INDEPENDENT_AMBULATORY_CARE_PROVIDER_SITE_OTHER): Payer: Medicare Other | Admitting: Vascular Surgery

## 2019-12-05 ENCOUNTER — Encounter: Payer: Self-pay | Admitting: Vascular Surgery

## 2019-12-05 VITALS — BP 130/71 | HR 92 | Temp 98.0°F | Resp 18 | Ht 66.0 in | Wt 143.0 lb

## 2019-12-05 DIAGNOSIS — I872 Venous insufficiency (chronic) (peripheral): Secondary | ICD-10-CM

## 2019-12-05 NOTE — Progress Notes (Signed)
Patient name: Laurie Ryan MRN: [id number] DOB: Mar 11, 1951 Sex: female  REASON FOR VISIT:   Follow-up of venous disease.  HPI:   Laurie Ryan is a pleasant 69 y.o. female who was seen by Laurence Slate, PA on 08/19/2019.  She had been referred with swollen left leg.  She had no previous history of DVT.  Her duplex scan showed evidence of chronic clot in the left small saphenous vein and some reflux at the saphenofemoral junction.  She was encouraged to wear her thigh-high compression stockings and elevate her legs and comes in for a 6-month follow-up visit.  On my history, she works at [employer] and is on her feet for long hours.  She gets pain mostly in the left posterior calf which is aggravated by standing for long hours.  She has no previous history of DVT and has had no prior vein procedures.  She does wear compression stockings which helped some.  These are knee-high stockings.  She also elevates her legs which help.  I do not get any history of claudication or rest pain.  She describes some aching and heaviness in her legs which is aggravated by sitting and standing and relieved with elevation.  Past Medical History:  Diagnosis Date  . Arthritis    lower back, shoulders  . Cancer ([place])    skin ca  . Depression   . Hypertension     Family History  Problem Relation Age of Onset  . Heart disease Mother   . Heart disease Brother   . Breast cancer Neg Hx     SOCIAL HISTORY: Social History   Tobacco Use  . Smoking status: Never Smoker  . Smokeless tobacco: Never Used  Substance Use Topics  . Alcohol use: No    Allergies  Allergen Reactions  . Codeine Other (See Comments)    Passed out    Current Outpatient Medications  Medication Sig Dispense Refill  . albuterol (PROVENTIL HFA;VENTOLIN HFA) 108 (90 Base) MCG/ACT inhaler Inhale 2 puffs into the lungs every 4 (four) hours as needed for wheezing or shortness of breath. 1 Inhaler 0  . aspirin EC 81 MG tablet Take  by mouth.    . benazepril (LOTENSIN) 5 MG tablet Take 5 mg by mouth daily.    Marland Kitchen escitalopram (LEXAPRO) 10 MG tablet Take 10 mg by mouth daily.    Marland Kitchen estradiol (ESTRACE) 0.1 MG/GM vaginal cream Place 1 Applicatorful vaginally at bedtime.    . fluticasone (FLONASE) 50 MCG/ACT nasal spray Place 2 sprays into both nostrils daily.    . hydrochlorothiazide (HYDRODIURIL) 25 MG tablet Take 25 mg by mouth daily.    Marland Kitchen ibuprofen (ADVIL,MOTRIN) 600 MG tablet Take 1 tablet (600 mg total) by mouth every 8 (eight) hours as needed. 15 tablet 0  . meloxicam (MOBIC) 15 MG tablet Take 15 mg by mouth daily.    . Multiple Vitamin (MULTIVITAMIN) capsule Take 1 capsule by mouth daily.    Marland Kitchen omeprazole (PRILOSEC) 20 MG capsule Take 20 mg by mouth daily.     No current facility-administered medications for this visit.    REVIEW OF SYSTEMS:  [X]  denotes positive finding, [ ]  denotes negative finding Cardiac  Comments:  Chest pain or chest pressure:    Shortness of breath upon exertion:    Short of breath when lying flat:    Irregular heart rhythm:        Vascular    Pain in calf, thigh, or hip brought on  by ambulation:    Pain in feet at night that wakes you up from your sleep:     Blood clot in your veins:    Leg swelling:         Pulmonary    Oxygen at home:    Productive cough:     Wheezing:         Neurologic    Sudden weakness in arms or legs:     Sudden numbness in arms or legs:     Sudden onset of difficulty speaking or slurred speech:    Temporary loss of vision in one eye:     Problems with dizziness:         Gastrointestinal    Blood in stool:     Vomited blood:         Genitourinary    Burning when urinating:     Blood in urine:        Psychiatric    Major depression:         Hematologic    Bleeding problems:    Problems with blood clotting too easily:        Skin    Rashes or ulcers:        Constitutional    Fever or chills:     PHYSICAL EXAM:   Vitals:   12/05/19 1415   BP: 130/71  Pulse: 92  Resp: 18  Temp: 98 F (36.7 C)  TempSrc: Temporal  SpO2: 97%  Weight: 143 lb (64.9 kg)  Height: 5\' 6"  (1.676 m)    GENERAL: The patient is a well-nourished female, in no acute distress. The vital signs are documented above. CARDIAC: There is a regular rate and rhythm.  VASCULAR: I do not detect carotid bruits. She has palpable femoral pulses and palpable pedal pulses on the left.  I cannot palpate pedal pulses on the right however she had biphasic Doppler signals in the dorsalis pedis and posterior tibial positions. She has some spider veins and telangiectasias bilaterally but no large truncal varicosities. PULMONARY: There is good air exchange bilaterally without wheezing or rales. ABDOMEN: Soft and non-tender with normal pitched bowel sounds.  MUSCULOSKELETAL: There are no major deformities or cyanosis. NEUROLOGIC: No focal weakness or paresthesias are detected. SKIN: There are no ulcers or rashes noted. PSYCHIATRIC: The patient has a normal affect.  DATA:    VENOUS DUPLEX: I reviewed the venous duplex scan that was done on her last visit.  She had no evidence of DVT.  She had no evidence of deep venous reflux.  She had reflux in the saphenofemoral junction only on the left.  There was no other superficial venous reflux.  The left small saphenous vein had chronic thrombus.  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  She has reflux in the left saphenofemoral junction.  Otherwise she had no significant deep or superficial venous reflux.  However her symptoms do sound venous in origin.  She has no evidence of significant arterial insufficiency.  I have encouraged her to elevate her legs after work and we have discussed the proper positioning for this.  In addition I have encouraged her to wear her knee-high compression stockings at work.  She should try to  avoid prolonged sitting and standing.  We have also discussed the importance of exercise.  If her symptoms progress I had be happy to see her back at any time.  Deitra Mayo [facility] [phone]

## 2019-12-09 ENCOUNTER — Ambulatory Visit
Admission: RE | Admit: 2019-12-09 | Discharge: 2019-12-09 | Disposition: A | Discharge status: Home / Self Care | Payer: BC Managed Care – PPO | Source: Ambulatory Visit | Attending: Family Medicine | Admitting: Family Medicine

## 2019-12-09 DIAGNOSIS — Z1231 Encounter for screening mammogram for malignant neoplasm of breast: Secondary | ICD-10-CM | POA: Diagnosis present

## 2020-10-23 ENCOUNTER — Other Ambulatory Visit: Payer: Self-pay | Admitting: Orthopedic Surgery

## 2020-10-23 DIAGNOSIS — M5442 Lumbago with sciatica, left side: Secondary | ICD-10-CM

## 2020-11-05 ENCOUNTER — Ambulatory Visit
Admission: RE | Admit: 2020-11-05 | Discharge: 2020-11-05 | Disposition: A | Discharge status: Home / Self Care | Payer: Medicare Other | Source: Ambulatory Visit | Attending: Orthopedic Surgery | Admitting: Orthopedic Surgery

## 2020-11-05 ENCOUNTER — Other Ambulatory Visit: Payer: Self-pay

## 2020-11-05 DIAGNOSIS — G8929 Other chronic pain: Secondary | ICD-10-CM | POA: Insufficient documentation

## 2020-11-05 DIAGNOSIS — M5442 Lumbago with sciatica, left side: Secondary | ICD-10-CM | POA: Diagnosis not present

## 2020-11-26 ENCOUNTER — Encounter: Payer: Self-pay | Admitting: Emergency Medicine

## 2020-11-26 ENCOUNTER — Emergency Department: Payer: Medicare Other

## 2020-11-26 ENCOUNTER — Other Ambulatory Visit: Payer: Self-pay

## 2020-11-26 DIAGNOSIS — Z85828 Personal history of other malignant neoplasm of skin: Secondary | ICD-10-CM | POA: Insufficient documentation

## 2020-11-26 DIAGNOSIS — I10 Essential (primary) hypertension: Secondary | ICD-10-CM | POA: Diagnosis not present

## 2020-11-26 DIAGNOSIS — Y92093 Driveway of other non-institutional residence as the place of occurrence of the external cause: Secondary | ICD-10-CM | POA: Insufficient documentation

## 2020-11-26 DIAGNOSIS — S6991XA Unspecified injury of right wrist, hand and finger(s), initial encounter: Secondary | ICD-10-CM | POA: Diagnosis present

## 2020-11-26 DIAGNOSIS — W000XXA Fall on same level due to ice and snow, initial encounter: Secondary | ICD-10-CM | POA: Diagnosis not present

## 2020-11-26 DIAGNOSIS — S52531A Colles' fracture of right radius, initial encounter for closed fracture: Secondary | ICD-10-CM | POA: Insufficient documentation

## 2020-11-26 DIAGNOSIS — Z79899 Other long term (current) drug therapy: Secondary | ICD-10-CM | POA: Diagnosis not present

## 2020-11-26 DIAGNOSIS — Z7982 Long term (current) use of aspirin: Secondary | ICD-10-CM | POA: Insufficient documentation

## 2020-11-26 NOTE — ED Notes (Signed)
PA state that pt will need reduction - not appropriate for flex

## 2020-11-26 NOTE — ED Triage Notes (Signed)
Pt to ED from home c/o fall tonight.  States slipped on ice behind her car and caught herself with her right arm.  Patient with swelling, deformity to right wrist.  Pt able to move fingers, cap refill <3 seconds, skin warm.

## 2020-11-27 ENCOUNTER — Emergency Department: Payer: Medicare Other

## 2020-11-27 ENCOUNTER — Emergency Department
Admission: EM | Admit: 2020-11-27 | Discharge: 2020-11-27 | Disposition: A | Discharge status: Home / Self Care | Payer: Medicare Other | Attending: Emergency Medicine | Admitting: Emergency Medicine

## 2020-11-27 DIAGNOSIS — S52531A Colles' fracture of right radius, initial encounter for closed fracture: Secondary | ICD-10-CM

## 2020-11-27 MED ORDER — MORPHINE SULFATE (PF) 4 MG/ML IV SOLN
4.0000 mg | Freq: Once | INTRAVENOUS | Status: AC
  Administered 2020-11-27: 4 mg via INTRAVENOUS
  Filled 2020-11-27: qty 1

## 2020-11-27 MED ORDER — TRAMADOL HCL 50 MG PO TABS: 50.0000 mg | ORAL_TABLET | Freq: Four times a day (QID) | ORAL | 0 refills | Status: AC | PRN

## 2020-11-27 MED ORDER — LIDOCAINE HCL (PF) 1 % IJ SOLN: 10.0000 mL | Freq: Once | INTRAMUSCULAR | Status: AC

## 2020-11-27 MED ORDER — LIDOCAINE HCL (PF) 1 % IJ SOLN
INTRAMUSCULAR | Status: AC
  Administered 2020-11-27: 10 mL via INTRADERMAL
  Filled 2020-11-27: qty 10

## 2020-11-27 MED ORDER — ONDANSETRON HCL 4 MG/2ML IJ SOLN
4.0000 mg | Freq: Once | INTRAMUSCULAR | Status: AC
  Administered 2020-11-27: 4 mg via INTRAVENOUS
  Filled 2020-11-27: qty 2

## 2020-11-27 MED ORDER — OXYCODONE HCL 5 MG PO TABS: 5.0000 mg | ORAL_TABLET | Freq: Once | ORAL | Status: DC

## 2020-11-27 MED ORDER — OXYCODONE-ACETAMINOPHEN 5-325 MG PO TABS: 1.0000 | ORAL_TABLET | Freq: Once | ORAL | Status: DC

## 2020-11-27 MED ORDER — ONDANSETRON 4 MG PO TBDP: 4.0000 mg | ORAL_TABLET | Freq: Once | ORAL | Status: DC

## 2020-11-27 MED ORDER — ACETAMINOPHEN 500 MG PO TABS: 1000.0000 mg | ORAL_TABLET | Freq: Three times a day (TID) | ORAL | 0 refills | Status: AC | PRN

## 2020-11-27 NOTE — ED Notes (Signed)
Deformity to wrist; pt instructed to remain NPO until eval by provider; acuity level changed

## 2020-11-27 NOTE — ED Notes (Signed)
MD at bedside. 

## 2020-11-27 NOTE — ED Provider Notes (Signed)
[facility] Emergency Department Provider Note  ____________________________________________  Time seen: Approximately 5:17 AM  I have reviewed the triage vital signs and the nursing notes.   HISTORY  Chief Complaint Wrist Pain   HPI Laurie Ryan is a 70 y.o. female who presents for evaluation of right wrist pain and deformity.  Patient slipped on ice on her driveway and caught herself with the right arm.  She is left hand dominant.  She is complaining of pain of the right wrist which has been constant, moderate in intensity, worse with movement.  She denies any other injuries from the fall.   Past Medical History:  Diagnosis Date  . Arthritis    lower back, shoulders  . Cancer ([place])    skin ca  . Depression   . Hypertension     Patient Active Problem List   Diagnosis Date Noted  . Depression 04/22/2019  . Hypertension 04/22/2019  . Porokeratosis 04/22/2019  . Plantar flexed metatarsal 04/22/2019  . Hallux limitus of right foot 04/22/2019    Past Surgical History:  Procedure Laterality Date  . BICEPT TENODESIS Right 09/04/2018   Procedure: BICEPS TENODESIS;  Surgeon: Leim Fabry, MD;  Location: [place];  Service: Orthopedics;  Laterality: Right;  . CHOLECYSTECTOMY    . COLONOSCOPY WITH PROPOFOL N/A 05/15/2015   Procedure: COLONOSCOPY WITH PROPOFOL;  Surgeon: Manya Silvas, MD;  Location[facility] ENDOSCOPY;  Service: Endoscopy;  Laterality: N/A;  . FOOT SURGERY    . SHOULDER ARTHROSCOPY Right 09/04/2018   Procedure: ARTHROSCOPY SHOULDER;  Surgeon: Leim Fabry, MD;  Location: [place];  Service: Orthopedics;  Laterality: Right;  . SHOULDER OPEN ROTATOR CUFF REPAIR Right 09/04/2018   Procedure: MINI OPEN ROTATOR CUFF REPAIR;  Surgeon: Leim Fabry, MD;  Location: [place];  Service: Orthopedics;  Laterality: Right;  . TONSILLECTOMY      Prior to Admission medications   Medication Sig Start Date End Date  Taking? Authorizing Provider  acetaminophen (TYLENOL) 500 MG tablet Take 2 tablets (1,000 mg total) by mouth every 8 (eight) hours as needed for mild pain, moderate pain, fever or headache. 11/27/20 11/27/21 Yes Alfred Levins, [place], MD  traMADol (ULTRAM) 50 MG tablet Take 1 tablet (50 mg total) by mouth every 6 (six) hours as needed. 11/27/20 11/27/21 Yes Zaydah Nawabi, [place], MD  albuterol (PROVENTIL HFA;VENTOLIN HFA) 108 (90 Base) MCG/ACT inhaler Inhale 2 puffs into the lungs every 4 (four) hours as needed for wheezing or shortness of breath. 02/11/16   Paulette Blanch, MD  aspirin EC 81 MG tablet Take by mouth.    [provider]  benazepril (LOTENSIN) 5 MG tablet Take 5 mg by mouth daily.    [provider]  escitalopram (LEXAPRO) 10 MG tablet Take 10 mg by mouth daily.    [provider]  estradiol (ESTRACE) 0.1 MG/GM vaginal cream Place 1 Applicatorful vaginally at bedtime.    [provider]  fluticasone (FLONASE) 50 MCG/ACT nasal spray Place 2 sprays into both nostrils daily.    [provider]  hydrochlorothiazide (HYDRODIURIL) 25 MG tablet Take 25 mg by mouth daily.    [provider]  ibuprofen (ADVIL,MOTRIN) 600 MG tablet Take 1 tablet (600 mg total) by mouth every 8 (eight) hours as needed. 01/17/19   Sable Feil, PA-C  meloxicam (MOBIC) 15 MG tablet Take 15 mg by mouth daily. 11/27/19   [provider]  Multiple Vitamin (MULTIVITAMIN) capsule Take 1 capsule by mouth daily.  [provider]  omeprazole (PRILOSEC) 20 MG capsule Take 20 mg by mouth daily.    [provider]    Allergies Codeine  Family History  Problem Relation Age of Onset  . Heart disease Mother   . Heart disease Brother   . Breast cancer Neg Hx     Social History Social History   Tobacco Use  . Smoking status: Never Smoker  . Smokeless tobacco: Never Used  Vaping Use  . Vaping Use: Never used  Substance Use Topics  . Alcohol use:  No  . Drug use: No    Review of Systems  Constitutional: Negative for fever. Eyes: Negative for visual changes. ENT: Negative for facial injury or neck injury Cardiovascular: Negative for chest injury. Respiratory: Negative for shortness of breath. Negative for chest wall injury. Gastrointestinal: Negative for abdominal pain or injury. Genitourinary: Negative for dysuria. Musculoskeletal: Negative for back injury, + R wrist pain Skin: Negative for laceration/abrasions. Neurological: Negative for head injury.   ____________________________________________   PHYSICAL EXAM:  VITAL SIGNS: ED Triage Vitals [11/26/20 1957]  Enc Vitals Group     BP 136/69     Pulse Rate 71     Resp 16     Temp 98.2 F (36.8 C)     Temp Source Oral     SpO2 97 %     Weight 140 lb (63.5 kg)     Height 5\' 6"  (1.676 m)     Head Circumference      Peak Flow      Pain Score 8     Pain Loc      Pain Edu?      Excl. in GC?      Constitutional: Alert and oriented. No acute distress. Does not appear intoxicated. HEENT Head: Normocephalic and atraumatic. Face: No facial bony tenderness. Stable midface Ears: No hemotympanum bilaterally. No Battle sign Eyes: No eye injury. PERRL. No raccoon eyes Nose: Nontender. No epistaxis. No rhinorrhea Mouth/Throat: Mucous membranes are moist. No oropharyngeal blood. No dental injury. Airway patent without stridor. Normal voice. Neck: no C-collar. No midline c-spine tenderness.  Cardiovascular: Normal rate, regular rhythm. Normal and symmetric distal pulses are present in all extremities. Pulmonary/Chest: Chest wall is stable and nontender to palpation/compression. Normal respiratory effort. Breath sounds are normal. No crepitus.  Abdominal: Soft, nontender, non distended. Musculoskeletal: Obvious deformity of the right wrist with intact distal perfusion and normal neurological exam.  No open lacerations nontender with normal full range of motion in all joints  on the RUE. No thoracic or lumbar midline spinal tenderness. Pelvis is stable. Skin: Skin is warm, dry and intact. No abrasions or contutions. Psychiatric: Speech and behavior are appropriate. Neurological: Normal speech and language. Moves all extremities to command. No gross focal neurologic deficits are appreciated.  Glascow Coma Score: 4 - Opens eyes on own 6 - Follows simple motor commands 5 - Alert and oriented GCS: 15   ____________________________________________   LABS (all labs ordered are listed, but only abnormal results are displayed)  Labs Reviewed - No data to display ____________________________________________  EKG  none  ____________________________________________  RADIOLOGY  I have personally reviewed the images performed during this visit and I agree with the Radiologist's read.   Interpretation by Radiologist:  DG Wrist Complete Right  Result Date: 11/27/2020 CLINICAL DATA:  Post reduction EXAM: RIGHT WRIST - COMPLETE 3+ VIEW COMPARISON:  X-ray 1 day prior FINDINGS: There is been interval placement of a fiberglass cast. There is  improved osseous alignment status post reduction. There is persistent dorsal angulation of the distal radius. There is surrounding soft tissue swelling. Again noted is a fracture of the distal radius and ulnar styloid process. IMPRESSION: Interval placement of a fiberglass cast with improved osseous alignment status post reduction. Electronically Signed   By: Katherine Mantle M.D.   On: 11/27/2020 05:09   DG Wrist Complete Right  Result Date: 11/26/2020 CLINICAL DATA:  Fall, injury EXAM: RIGHT WRIST - COMPLETE 3+ VIEW COMPARISON:  None. FINDINGS: Acute mildly displaced fracture involving the ulnar styloid process. Acute mildly comminuted and impacted intra-articular distal radius fracture with moderate dorsal angulation of distal fracture fragment and close to 1/2 shaft diameter dorsal displacement. Positive for soft tissue  swelling. IMPRESSION: 1. Acute mildly displaced ulnar styloid process fracture. 2. Acute comminuted, angulated and displaced intra-articular distal radius fracture. Electronically Signed   By: Jasmine Pang M.D.   On: 11/26/2020 20:16     ____________________________________________   PROCEDURES  Procedure(s) performed:yes .Ortho Injury Treatment  Date/Time: 11/27/2020 5:19 AM Performed by: Nita Sickle, MD Authorized by: Nita Sickle, MD   Consent:    Consent obtained:  Verbal   Consent given by:  Patient   Risks discussed:  Fracture, nerve damage, restricted joint movement, vascular damage, stiffness, recurrent dislocation and irreducible dislocation   Alternatives discussed:  Immobilization and referralInjury location: wrist Location details: right wrist Injury type: fracture Fracture type: distal radius and ulnar styloid Pre-procedure neurovascular assessment: neurovascularly intact Pre-procedure distal perfusion: normal Pre-procedure neurological function: normal Pre-procedure range of motion: reduced Anesthesia: hematoma block  Anesthesia: Local anesthesia used: yes Local Anesthetic: lidocaine 1% without epinephrine Anesthetic total: 7 mL  Patient sedated: NoManipulation performed: yes Skeletal traction used: yes X-ray confirmed reduction: yes Immobilization: splint Splint type: sugar tong Supplies used: cotton padding and Ortho-Glass Post-procedure neurovascular assessment: post-procedure neurovascularly intact Post-procedure distal perfusion: normal Post-procedure neurological function: normal Post-procedure range of motion: improved Patient tolerance: patient tolerated the procedure well with no immediate complications    Critical Care performed:  None ____________________________________________   INITIAL IMPRESSION / ASSESSMENT AND PLAN / ED COURSE   70 y.o. female who presents for evaluation of right wrist pain and deformity status post  mechanical fall.  X-ray visualized by me showing ulnar styloid fracture and distal radius fracture, confirmed by radiology.  Fracture was reduced to the best of my ability with hematoma block, finger trapping for 30 minutes, and manipulation.  Patient placed on a splint with Ortho-Glass.  Discussed signs and symptoms of acute compartment syndrome and recommended return to the emergency room if these develop.  Otherwise patient is to call Dr. Binnie Rail office first thing in the morning for an appointment.  We will provide patient with a short course of tramadol for pain control.  Discussed my standard return precautions.        ____________________________________________  Please note:  Patient was evaluated in Emergency Department today for the symptoms described in the history of present illness. Patient was evaluated in the context of the global COVID-19 pandemic, which necessitated consideration that the patient might be at risk for infection with the SARS-CoV-2 virus that causes COVID-19. Institutional protocols and algorithms that pertain to the evaluation of patients at risk for COVID-19 are in a state of rapid change based on information released by regulatory bodies including the CDC and federal and state organizations. These policies and algorithms were followed during the patient's care in the ED.  Some ED evaluations and interventions may be  delayed as a result of limited staffing during the pandemic.   ____________________________________________   FINAL CLINICAL IMPRESSION(S) / ED DIAGNOSES   Final diagnoses:  Closed Colles' fracture of right radius, initial encounter      NEW MEDICATIONS STARTED DURING THIS VISIT:  ED Discharge Orders         Ordered    traMADol (ULTRAM) 50 MG tablet  Every 6 hours PRN        11/27/20 0517    acetaminophen (TYLENOL) 500 MG tablet  Every 8 hours PRN        11/27/20 0517           Note:  This document was prepared using Dragon voice  recognition software and may include unintentional dictation errors.    Rudene Re, MD 11/27/20 [phone]

## 2020-11-27 NOTE — Discharge Instructions (Signed)
Elevate, apply ice. Keep splint dry and in place until cleared by Orthopedics.Follow up with Orthopedics per recommendation on your follow up discharge instructions. Take pain medication as follows:  Tylenol 1000 mg every 8 hours, tramadol 50mg  every 4-6 hours for breakthrough pain.  If your pain becomes severe, if your fingers become blue/purple or pale, or if you experience pins-and-needles sensation in your fingers and hand, remove the elastic wrap and reapply it looser.  Do not change the hard part of the splint.  If that resolves your symptoms it is okay to stay home and follow-up with orthopedics.  If you continue to have those symptoms please return to the emergency room immediately.

## 2020-12-01 ENCOUNTER — Other Ambulatory Visit
Admission: RE | Admit: 2020-12-01 | Discharge: 2020-12-01 | Disposition: A | Discharge status: Home / Self Care | Payer: Medicare Other | Source: Ambulatory Visit | Attending: Orthopedic Surgery | Admitting: Orthopedic Surgery

## 2020-12-01 ENCOUNTER — Other Ambulatory Visit: Payer: Self-pay | Admitting: Orthopedic Surgery

## 2020-12-01 ENCOUNTER — Other Ambulatory Visit: Payer: Self-pay

## 2020-12-01 DIAGNOSIS — Z20822 Contact with and (suspected) exposure to covid-19: Secondary | ICD-10-CM | POA: Insufficient documentation

## 2020-12-01 DIAGNOSIS — Z01818 Encounter for other preprocedural examination: Secondary | ICD-10-CM | POA: Insufficient documentation

## 2020-12-01 DIAGNOSIS — Z01812 Encounter for preprocedural laboratory examination: Secondary | ICD-10-CM | POA: Insufficient documentation

## 2020-12-01 LAB — BASIC METABOLIC PANEL
Anion gap: 13 (ref 5–15)
BUN: 31 mg/dL — ABNORMAL HIGH (ref 8–23)
CO2: 26 mmol/L (ref 22–32)
Calcium: 9.2 mg/dL (ref 8.9–10.3)
Chloride: 98 mmol/L (ref 98–111)
Creatinine, Ser: 1.1 mg/dL — ABNORMAL HIGH (ref 0.44–1.00)
GFR, Estimated: 55 mL/min — ABNORMAL LOW (ref >60)
Glucose, Bld: 114 mg/dL — ABNORMAL HIGH (ref 70–99)
Potassium: 3.4 mmol/L — ABNORMAL LOW (ref 3.5–5.1)
Sodium: 137 mmol/L (ref 135–145)

## 2020-12-01 LAB — CBC
HCT: 35.9 % — ABNORMAL LOW (ref 36.0–46.0)
Hemoglobin: 12.3 g/dL (ref 12.0–15.0)
MCH: 31.8 pg (ref 26.0–34.0)
MCHC: 34.3 g/dL (ref 30.0–36.0)
MCV: 92.8 fL (ref 80.0–100.0)
Platelets: 257 10*3/uL (ref 150–400)
RBC: 3.87 MIL/uL (ref 3.87–5.11)
RDW: 11.9 % (ref 11.5–15.5)
WBC: 7.9 10*3/uL (ref 4.0–10.5)
nRBC: 0 % (ref 0.0–0.2)

## 2020-12-01 LAB — SARS CORONAVIRUS 2 (TAT 6-24 HRS): SARS Coronavirus 2: NEGATIVE

## 2020-12-01 NOTE — Patient Instructions (Signed)
Your procedure is scheduled on: Thursday December 03, 2020. Report to Day Surgery inside [facility] 2nd floor (stop by Admissions desk first, before going upstairs). To find out your arrival time please call [phone] between 1PM - 3PM on Wednesday December 02, 2020.  Remember: Instructions that are not followed completely may result in serious medical risk,  up to and including death, or upon the discretion of your surgeon and anesthesiologist your  surgery may need to be rescheduled.     _X__ 1. Do not eat food after midnight the night before your procedure.                 No chewing gum or hard candies. You may drink clear liquids up to 2 hours                 before you are scheduled to arrive for your surgery- DO not drink clear                 liquids within 2 hours of the start of your surgery.                 Clear Liquids include:  water, apple juice without pulp, clear Gatorade, G2 or                  Gatorade Zero (avoid Red/Purple/Blue), Black Coffee or Tea (Do not add                 anything to coffee or tea).  __X__2.   Complete the "Ensure Clear Pre-surgery Clear Carbohydrate Drink" provided to you, 2 hours before arrival. **If you are diabetic you will be provided with an alternative drink, Gatorade Zero or G2.  __X__3.  On the morning of surgery brush your teeth with toothpaste and water, you                may rinse your mouth with mouthwash if you wish.  Do not swallow any toothpaste of mouthwash.     _X__ 4.  No Alcohol for 24 hours before or after surgery.   _X__ 5.  Do Not Smoke or use e-cigarettes For 24 Hours Prior to Your Surgery.                 Do not use any chewable tobacco products for at least 6 hours prior to                 Surgery.  _X__  6.  Do not use any recreational drugs (marijuana, cocaine, heroin, ecstasy, MDMA or other)                For at least one week prior to your surgery.  Combination of these drugs with  anesthesia                May have life threatening results.  __X__  7.  Notify your doctor if there is any change in your medical condition      (cold, fever, infections).     Do not wear jewelry, make-up, hairpins, clips or nail polish. Do not wear lotions, powders, or perfumes. You may wear deodorant. Do not shave 48 hours prior to surgery. Men may shave face and neck. Do not bring valuables to the hospital.    [facility] is not responsible for any belongings or valuables.  Contacts, dentures or bridgework may not be worn into surgery. Leave your suitcase in the car. After surgery  it may be brought to your room. For patients admitted to the hospital, discharge time is determined by your treatment team.   Patients discharged the day of surgery will not be allowed to drive home.   Make arrangements for someone to be with you for the first 24 hours of your Same Day Discharge.   __X__ Take these medicines the morning of surgery with A SIP OF WATER:    1. None     ____ Fleet Enema (as directed)   __X__ Use CHG Soap (or wipes) as directed  ____ Use Benzoyl Peroxide Gel as instructed  ____ Use inhalers on the day of surgery  ____ Stop metformin 2 days prior to surgery    ____ Take 1/2 of usual insulin dose the night before surgery. No insulin the morning          of surgery.   __X__ Stop Anti-inflammatories such as ibuprofen (ADVIL,MOTRIN), naproxen, aspirin and or BC powders.    __X__ Stop supplements until after surgery.    __X__ Do not start any herbal supplements before your procedure.     If you have any questions regarding your pre-procedure instructions,  Please call Pre-admit Testing at [phone].

## 2020-12-02 ENCOUNTER — Inpatient Hospital Stay: Admission: RE | Admit: 2020-12-02 | Payer: Medicare Other | Source: Ambulatory Visit

## 2020-12-03 ENCOUNTER — Ambulatory Visit: Payer: Medicare Other

## 2020-12-03 ENCOUNTER — Ambulatory Visit
Admission: RE | Admit: 2020-12-03 | Discharge: 2020-12-03 | Disposition: A | Discharge status: Home / Self Care | Payer: Medicare Other | Source: Ambulatory Visit | Attending: Orthopedic Surgery | Admitting: Orthopedic Surgery

## 2020-12-03 ENCOUNTER — Encounter: Payer: Self-pay | Admitting: Orthopedic Surgery

## 2020-12-03 ENCOUNTER — Ambulatory Visit: Payer: Medicare Other | Admitting: Anesthesiology

## 2020-12-03 ENCOUNTER — Encounter
Admission: RE | Disposition: A | Discharge status: Home / Self Care | Payer: Self-pay | Source: Ambulatory Visit | Attending: Orthopedic Surgery

## 2020-12-03 DIAGNOSIS — W000XXA Fall on same level due to ice and snow, initial encounter: Secondary | ICD-10-CM | POA: Insufficient documentation

## 2020-12-03 DIAGNOSIS — Z79899 Other long term (current) drug therapy: Secondary | ICD-10-CM | POA: Diagnosis not present

## 2020-12-03 DIAGNOSIS — Z9889 Other specified postprocedural states: Secondary | ICD-10-CM

## 2020-12-03 DIAGNOSIS — S52551A Other extraarticular fracture of lower end of right radius, initial encounter for closed fracture: Secondary | ICD-10-CM | POA: Insufficient documentation

## 2020-12-03 DIAGNOSIS — Z7982 Long term (current) use of aspirin: Secondary | ICD-10-CM | POA: Insufficient documentation

## 2020-12-03 DIAGNOSIS — Z419 Encounter for procedure for purposes other than remedying health state, unspecified: Secondary | ICD-10-CM

## 2020-12-03 DIAGNOSIS — Z8781 Personal history of (healed) traumatic fracture: Secondary | ICD-10-CM

## 2020-12-03 HISTORY — PX: OPEN REDUCTION INTERNAL FIXATION (ORIF) DISTAL RADIAL FRACTURE: SHX5989

## 2020-12-03 SURGERY — OPEN REDUCTION INTERNAL FIXATION (ORIF) DISTAL RADIUS FRACTURE
Anesthesia: General | Site: Wrist | Laterality: Right

## 2020-12-03 MED ORDER — NEOMYCIN-POLYMYXIN B GU 40-200000 IR SOLN
Status: DC | PRN
  Administered 2020-12-03: 2 mL

## 2020-12-03 MED ORDER — MIDAZOLAM HCL 2 MG/2ML IJ SOLN
INTRAMUSCULAR | Status: DC | PRN
  Administered 2020-12-03: 2 mg via INTRAVENOUS

## 2020-12-03 MED ORDER — SODIUM CHLORIDE 0.9 % IV SOLN: INTRAVENOUS | Status: DC

## 2020-12-03 MED ORDER — ONDANSETRON HCL 4 MG PO TABS: 4.0000 mg | ORAL_TABLET | Freq: Four times a day (QID) | ORAL | Status: DC | PRN

## 2020-12-03 MED ORDER — PROPOFOL 10 MG/ML IV BOLUS
INTRAVENOUS | Status: DC | PRN
  Administered 2020-12-03: 150 mg via INTRAVENOUS

## 2020-12-03 MED ORDER — LIDOCAINE HCL (PF) 1 % IJ SOLN
INTRAMUSCULAR | Status: DC | PRN
  Administered 2020-12-03: 5 mL via SUBCUTANEOUS

## 2020-12-03 MED ORDER — FENTANYL CITRATE (PF) 100 MCG/2ML IJ SOLN
INTRAMUSCULAR | Status: AC
  Filled 2020-12-03: qty 2

## 2020-12-03 MED ORDER — ONDANSETRON HCL 4 MG/2ML IJ SOLN: 4.0000 mg | Freq: Four times a day (QID) | INTRAMUSCULAR | Status: DC | PRN

## 2020-12-03 MED ORDER — DEXAMETHASONE SODIUM PHOSPHATE 10 MG/ML IJ SOLN
INTRAMUSCULAR | Status: DC | PRN
  Administered 2020-12-03: 10 mg via INTRAVENOUS

## 2020-12-03 MED ORDER — NEOMYCIN-POLYMYXIN B GU 40-200000 IR SOLN
Status: AC
  Filled 2020-12-03: qty 2

## 2020-12-03 MED ORDER — METOCLOPRAMIDE HCL 10 MG PO TABS: 5.0000 mg | ORAL_TABLET | Freq: Three times a day (TID) | ORAL | Status: DC | PRN

## 2020-12-03 MED ORDER — LIDOCAINE HCL (CARDIAC) PF 100 MG/5ML IV SOSY
PREFILLED_SYRINGE | INTRAVENOUS | Status: DC | PRN
  Administered 2020-12-03: 100 mg via INTRAVENOUS

## 2020-12-03 MED ORDER — ROPIVACAINE HCL 5 MG/ML IJ SOLN
INTRAMUSCULAR | Status: AC
  Filled 2020-12-03: qty 30

## 2020-12-03 MED ORDER — ONDANSETRON HCL 4 MG/2ML IJ SOLN: 4.0000 mg | Freq: Once | INTRAMUSCULAR | Status: DC | PRN

## 2020-12-03 MED ORDER — CEFAZOLIN SODIUM-DEXTROSE 2-4 GM/100ML-% IV SOLN
INTRAVENOUS | Status: AC
  Filled 2020-12-03: qty 100

## 2020-12-03 MED ORDER — CHLORHEXIDINE GLUCONATE 0.12 % MT SOLN
OROMUCOSAL | Status: AC
  Administered 2020-12-03: 15 mL via OROMUCOSAL
  Filled 2020-12-03: qty 15

## 2020-12-03 MED ORDER — MIDAZOLAM HCL 2 MG/2ML IJ SOLN
INTRAMUSCULAR | Status: AC
  Filled 2020-12-03: qty 2

## 2020-12-03 MED ORDER — CHLORHEXIDINE GLUCONATE 0.12 % MT SOLN: 15.0000 mL | Freq: Once | OROMUCOSAL | Status: AC

## 2020-12-03 MED ORDER — CEFAZOLIN SODIUM-DEXTROSE 2-4 GM/100ML-% IV SOLN
2.0000 g | INTRAVENOUS | Status: AC
  Administered 2020-12-03: 2 g via INTRAVENOUS

## 2020-12-03 MED ORDER — ORAL CARE MOUTH RINSE: 15.0000 mL | Freq: Once | OROMUCOSAL | Status: AC

## 2020-12-03 MED ORDER — FAMOTIDINE 20 MG PO TABS: 20.0000 mg | ORAL_TABLET | Freq: Once | ORAL | Status: AC

## 2020-12-03 MED ORDER — FENTANYL CITRATE (PF) 100 MCG/2ML IJ SOLN
INTRAMUSCULAR | Status: DC | PRN
  Administered 2020-12-03 (×2): 50 ug via INTRAVENOUS

## 2020-12-03 MED ORDER — PHENYLEPHRINE HCL (PRESSORS) 10 MG/ML IV SOLN
INTRAVENOUS | Status: DC | PRN
  Administered 2020-12-03 (×2): 100 ug via INTRAVENOUS

## 2020-12-03 MED ORDER — ACETAMINOPHEN 10 MG/ML IV SOLN
INTRAVENOUS | Status: DC | PRN
  Administered 2020-12-03: 1000 mg via INTRAVENOUS

## 2020-12-03 MED ORDER — ROPIVACAINE HCL 5 MG/ML IJ SOLN
INTRAMUSCULAR | Status: DC | PRN
  Administered 2020-12-03: 30 mL via PERINEURAL

## 2020-12-03 MED ORDER — METOCLOPRAMIDE HCL 5 MG/ML IJ SOLN: 5.0000 mg | Freq: Three times a day (TID) | INTRAMUSCULAR | Status: DC | PRN

## 2020-12-03 MED ORDER — ONDANSETRON HCL 4 MG/2ML IJ SOLN
INTRAMUSCULAR | Status: DC | PRN
  Administered 2020-12-03: 4 mg via INTRAVENOUS

## 2020-12-03 MED ORDER — FENTANYL CITRATE (PF) 100 MCG/2ML IJ SOLN
INTRAMUSCULAR | Status: AC
  Filled 2020-12-03: qty 4

## 2020-12-03 MED ORDER — TRAMADOL HCL 50 MG PO TABS: 50.0000 mg | ORAL_TABLET | Freq: Four times a day (QID) | ORAL | 0 refills | Status: AC | PRN

## 2020-12-03 MED ORDER — LACTATED RINGERS IV SOLN: INTRAVENOUS | Status: DC

## 2020-12-03 MED ORDER — FAMOTIDINE 20 MG PO TABS
ORAL_TABLET | ORAL | Status: AC
  Administered 2020-12-03: 20 mg via ORAL
  Filled 2020-12-03: qty 1

## 2020-12-03 MED ORDER — LIDOCAINE HCL (PF) 1 % IJ SOLN
INTRAMUSCULAR | Status: AC
  Filled 2020-12-03: qty 5

## 2020-12-03 MED ORDER — FENTANYL CITRATE (PF) 100 MCG/2ML IJ SOLN
25.0000 ug | INTRAMUSCULAR | Status: DC | PRN
  Administered 2020-12-03: 50 ug via INTRAVENOUS

## 2020-12-03 SURGICAL SUPPLY — 42 items
APL PRP STRL LF DISP 70% ISPRP (MISCELLANEOUS) ×1
BIT DRILL 2 FAST STEP (BIT) ×2 IMPLANT
BIT DRILL 2.5X4 QC (BIT) ×2 IMPLANT
BNDG ELASTIC 4X5.8 VLCR STR LF (GAUZE/BANDAGES/DRESSINGS) ×2 IMPLANT
CANISTER SUCT 1200ML W/VALVE (MISCELLANEOUS) ×2 IMPLANT
CHLORAPREP W/TINT 26 (MISCELLANEOUS) ×2 IMPLANT
COVER WAND RF STERILE (DRAPES) ×2 IMPLANT
CUFF TOURN SGL QUICK 18X4 (TOURNIQUET CUFF) IMPLANT
DRAPE FLUOR MINI C-ARM 54X84 (DRAPES) ×2 IMPLANT
ELECT REM PT RETURN 9FT ADLT (ELECTROSURGICAL) ×2
ELECTRODE REM PT RTRN 9FT ADLT (ELECTROSURGICAL) ×1 IMPLANT
GAUZE SPONGE 4X4 12PLY STRL (GAUZE/BANDAGES/DRESSINGS) ×2 IMPLANT
GAUZE XEROFORM 1X8 LF (GAUZE/BANDAGES/DRESSINGS) ×4 IMPLANT
GLOVE SURG SYN 9.0  PF PI (GLOVE) ×1
GLOVE SURG SYN 9.0 PF PI (GLOVE) ×1 IMPLANT
GOWN SRG 2XL LVL 4 RGLN SLV (GOWNS) ×1 IMPLANT
GOWN STRL NON-REIN 2XL LVL4 (GOWNS) ×2
GOWN STRL REUS W/ TWL LRG LVL3 (GOWN DISPOSABLE) ×1 IMPLANT
GOWN STRL REUS W/TWL LRG LVL3 (GOWN DISPOSABLE) ×2
K-WIRE 1.6 (WIRE) ×2
K-WIRE FX5X1.6XNS BN SS (WIRE) ×1
KIT TURNOVER KIT A (KITS) ×2 IMPLANT
KWIRE FX5X1.6XNS BN SS (WIRE) ×1 IMPLANT
MANIFOLD NEPTUNE II (INSTRUMENTS) ×2 IMPLANT
NEEDLE FILTER BLUNT 18X 1/2SAF (NEEDLE) ×1
NEEDLE FILTER BLUNT 18X1 1/2 (NEEDLE) ×1 IMPLANT
NS IRRIG 500ML POUR BTL (IV SOLUTION) ×2 IMPLANT
PACK EXTREMITY ARMC (MISCELLANEOUS) ×2 IMPLANT
PAD CAST CTTN 4X4 STRL (SOFTGOODS) ×2 IMPLANT
PADDING CAST COTTON 4X4 STRL (SOFTGOODS) ×4
PEG SUBCHONDRAL SMOOTH 2.0X14 (Peg) ×2 IMPLANT
PEG SUBCHONDRAL SMOOTH 2.0X16 (Peg) ×4 IMPLANT
PEG SUBCHONDRAL SMOOTH 2.0X18 (Peg) ×6 IMPLANT
PLATE SHORT 21.6X48.9 NRRW RT (Plate) ×2 IMPLANT
SCALPEL PROTECTED #15 DISP (BLADE) ×4 IMPLANT
SCREW PEG 2.5X10 NONLOCK (Screw) ×6 IMPLANT
SPLINT CAST 1 STEP 3X12 (MISCELLANEOUS) ×2 IMPLANT
SUT ETHILON 4-0 (SUTURE) ×2
SUT ETHILON 4-0 FS2 18XMFL BLK (SUTURE) ×1
SUT VICRYL 3-0 27IN (SUTURE) ×2 IMPLANT
SUTURE ETHLN 4-0 FS2 18XMF BLK (SUTURE) ×1 IMPLANT
SYR 3ML LL SCALE MARK (SYRINGE) ×2 IMPLANT

## 2020-12-03 NOTE — Discharge Instructions (Addendum)
° ° ° °  AMBULATORY SURGERY  DISCHARGE INSTRUCTIONS   1) The drugs that you were given will stay in your system until tomorrow so for the next 24 hours you should not:  A) Drive an automobile B) Make any legal decisions C) Drink any alcoholic beverage   2) You may resume regular meals tomorrow.  Today it is better to start with liquids and gradually work up to solid foods.  You may eat anything you prefer, but it is better to start with liquids, then soup and crackers, and gradually work up to solid foods.   3) Please notify your doctor immediately if you have any unusual bleeding, trouble breathing, redness and pain at the surgery site, drainage, fever, or pain not relieved by medication.    4) Additional Instructions:        Please contact your physician with any problems or Same Day Surgery at [phone], Monday through Friday 6 am to 4 pm, or [place] at [facility] number at [phone].Keep arm elevated with ice to the back of the wrist is much as possible today and tomorrow. Work on finger motion all you can. Pain medicine as directed. If fingers swell a great deal loosen Ace wrap but leave cotton roll and splint in place.

## 2020-12-03 NOTE — Anesthesia Preprocedure Evaluation (Signed)
Anesthesia Evaluation  Patient identified by MRN, date of birth, ID band Patient awake    Reviewed: Allergy & Precautions, H&P , NPO status , Patient's Chart, lab work & pertinent test results, reviewed documented beta blocker date and time   History of Anesthesia Complications Negative for: history of anesthetic complications  Airway Mallampati: II  TM Distance: >3 FB Neck ROM: full    Dental  (+) Poor Dentition, Chipped, Dental Advidsory Given   Pulmonary neg pulmonary ROS,    Pulmonary exam normal breath sounds clear to auscultation       Cardiovascular Exercise Tolerance: Good hypertension, (-) angina(-) Past MI and (-) Cardiac Stents Normal cardiovascular exam(-) dysrhythmias (-) Valvular Problems/Murmurs Rhythm:regular Rate:Normal     Neuro/Psych PSYCHIATRIC DISORDERS Depression negative neurological ROS     GI/Hepatic negative GI ROS, Neg liver ROS,   Endo/Other  negative endocrine ROS  Renal/GU negative Renal ROS  negative genitourinary   Musculoskeletal   Abdominal   Peds  Hematology negative hematology ROS (+)   Anesthesia Other Findings Past Medical History:   Hypertension                                                 Depression                                                   Reproductive/Obstetrics negative OB ROS                             Anesthesia Physical  Anesthesia Plan  ASA: II  Anesthesia Plan: General   Post-op Pain Management:  Regional for Post-op pain   Induction: Intravenous  PONV Risk Score and Plan: 3 and Ondansetron, Dexamethasone and Treatment may vary due to age or medical condition  Airway Management Planned: LMA  Additional Equipment:   Intra-op Plan:   Post-operative Plan: Extubation in OR  Informed Consent: I have reviewed the patients History and Physical, chart, labs and discussed the procedure including the risks, benefits and  alternatives for the proposed anesthesia with the patient or authorized representative who has indicated his/her understanding and acceptance.       Plan Discussed with: Anesthesiologist, CRNA and Surgeon  Anesthesia Plan Comments:         Anesthesia Quick Evaluation

## 2020-12-03 NOTE — Anesthesia Postprocedure Evaluation (Signed)
Anesthesia Post Note  Patient: GWENITH TSCHIDA  Procedure(s) Performed: OPEN REDUCTION INTERNAL FIXATION (ORIF) DISTAL RADIAL FRACTURE (Right Wrist)  Patient location during evaluation: PACU Anesthesia Type: General Level of consciousness: awake and alert Pain management: pain level controlled Vital Signs Assessment: post-procedure vital signs reviewed and stable Respiratory status: spontaneous breathing, nonlabored ventilation, respiratory function stable and patient connected to nasal cannula oxygen Cardiovascular status: blood pressure returned to baseline and stable Postop Assessment: no apparent nausea or vomiting Anesthetic complications: no   No complications documented.   Last Vitals:  Vitals:   12/03/20 1225 12/03/20 1255  BP: 139/66 (!) 110/50  Pulse: 72 73  Resp: 16 16  Temp: (!) 36.1 C   SpO2: 94% 94%    Last Pain:  Vitals:   12/03/20 1255  TempSrc:   PainSc: 0-No pain                 Martha Clan

## 2020-12-03 NOTE — Anesthesia Procedure Notes (Signed)
Procedure Name: LMA Insertion Date/Time: 12/03/2020 10:30 AM Performed by: Nelda Marseille, CRNA Pre-anesthesia Checklist: Patient identified, Patient being monitored, Timeout performed, Emergency Drugs available and Suction available Patient Re-evaluated:Patient Re-evaluated prior to induction Oxygen Delivery Method: Circle system utilized Preoxygenation: Pre-oxygenation with 100% oxygen Induction Type: IV induction Ventilation: Mask ventilation without difficulty LMA: LMA inserted LMA Size: 4.0 Tube type: Oral Number of attempts: 1 Placement Confirmation: positive ETCO2 and breath sounds checked- equal and bilateral Tube secured with: Tape Dental Injury: Teeth and Oropharynx as per pre-operative assessment

## 2020-12-03 NOTE — Op Note (Signed)
12/03/2020  10:58 AM  PATIENT:  Laurie Ryan  70 y.o. female  PRE-OPERATIVE DIAGNOSIS:  S52.591A extra-articular distal radius fracture right  POST-OPERATIVE DIAGNOSIS:  S52.591A same  PROCEDURE:  Procedure(s): OPEN REDUCTION INTERNAL FIXATION (ORIF) DISTAL RADIAL FRACTURE (Right)  SURGEON: Laurene Footman, MD  ASSISTANTS: None  ANESTHESIA:   general  EBL:  Total I/O In: 700 [I.V.:500; IV Piggyback:200] Out: 1 [Blood:1]  BLOOD ADMINISTERED:none  DRAINS: none   LOCAL MEDICATIONS USED:  NONE  SPECIMEN:  No Specimen  DISPOSITION OF SPECIMEN:  N/A  COUNTS:  YES  TOURNIQUET:   Total Tourniquet Time Documented: Upper Arm (Right) - -166063 minutes Total: Upper Arm (Right) - -016010 minutes   IMPLANTS: [employer], short narrow DVR plate with multiple smooth pegs and screws  DICTATION: .Dragon Dictation patient was brought the operating room and after adequate anesthesia was obtained the right arm was prepped draped you sterile fashion with a tourniquet by the upper arm.   After patient identification and timeout procedures were completed tourniquet was raised a volar approach was utilized centered over the FCR tendon.  The tendon sheath was incised and the tendon retracted radially deep fascia incised and the pronator elevated off the distal and proximal fragments.  A Freer elevator was used to disengage the volar spike and this allowed for placement of the plate distally pinned in position checking appropriate.  Plate position was obtained and a distal first technique was utilized filling the sick screw holes with smooth pegs drilling measuring and placing the smooth pegs so they were tight up against the plate.  Then the plate was brought down to the shaft and this restored volar tilt to an anatomic position with 3 10 mm cortical screws maintaining alignment.  Under fluoroscopic views no penetration of pins into the joint.  Tourniquet was let down with traction  released fluoroscopic view showed stable fixation.  The wound was irrigated and then closed with 3-0 Vicryl subcutaneously 4-0 nylon for the skin followed by Xeroform 4 x 4's web roll volar splint and Ace wrap.  PLAN OF CARE: Discharge to home after PACU  PATIENT DISPOSITION:  PACU - hemodynamically stable.

## 2020-12-03 NOTE — Anesthesia Procedure Notes (Signed)
Anesthesia Regional Block: Supraclavicular block   Pre-Anesthetic Checklist: ,, timeout performed, Correct Patient, Correct Site, Correct Laterality, Correct Procedure, Correct Position, site marked, Risks and benefits discussed,  Surgical consent,  Pre-op evaluation,  At surgeon's request and post-op pain management  Laterality: Left and Upper  Prep: chloraprep       Needles:  Injection technique: Single-shot  Needle Type: Stimiplex     Needle Length: 5cm  Needle Gauge: 22     Additional Needles:   Procedures:,,,, ultrasound used (permanent image in chart),,,,  Narrative:  Start time: 12/03/2020 11:59 AM End time: 12/03/2020 12:02 PM Injection made incrementally with aspirations every 5 mL.  Performed by: Personally  Anesthesiologist: Martha Clan, MD  Additional Notes: Functioning IV was confirmed and monitors were applied.  A 32mm 22ga Stimuplex needle was used. Sterile prep and drape,hand hygiene and sterile gloves were used.  Negative aspiration and negative test dose prior to incremental administration of local anesthetic. The patient tolerated the procedure well.

## 2020-12-03 NOTE — H&P (Signed)
Chief Complaint  Patient presents with  . Left Wrist Fracture  [place] on ice 11/26/2020   Laurie Ryan is a 70 y.o. female who presents today for evaluation status post a right distal radius injury. The patient was seen in the emergency room after suffering a fall on 11/26/2020. The patient was walking when she slipped on some ice, she reached out with her right hand in an attempt to catch her self and landed directly onto her right wrist. The patient had immediate pain, swelling and a deformity to the right wrist. At the emergency room x-rays of the right wrist demonstrated a comminuted, angulated distal radius fracture in addition to a mildly displaced ulnar styloid fracture the patient did undergo a closed reduction which did slightly improve the alignment of the distal radius. The patient was placed in a sugar tong splint and instructed to follow-up orthopedics. The patient is left-hand dominant. She does live at home and does all of her daily activities on her own. She is taking Tylenol as needed for discomfort at this time. Pain score today is a 7 out of 10. She denies any numbness or tingling to the right upper extremity. No previous injury or trauma to the right wrist, no previous surgical history for the right wrist. She denies any personal history of heart attack, stroke, asthma or COPD.  Past Medical History: Past Medical History:  Diagnosis Date  . Depression  . Hypertension   Past Surgical History: Past Surgical History:  Procedure Laterality Date  . COLONOSCOPY 05/15/2015  FH Colon Polyps (Brother): CBF 05/2020  . COLONOSCOPY 08/20/2020  Normal colon/FHx CP/Repeat 21yrs/CTL  . EGD 08/20/2020  +H.Pylori/Gastritis/Esophagitis/Repeat as needed/CTL  . FOOT SURGERY 2011  . LAPAROSCOPIC CHOLECYSTECTOMY  . RIGHT MINI-OPEN ROTATOR CUFF REPAIR. OPEN BICEPS TENODESIS, ARTHROSCOPIC EXTENSIVE DEBRIDEMENT OF SHOULDER(GLENOHUMERAL AND SUBACROMIAL SPACES) ARTHROSCOPIC SUBACROMIAL DECPMPRESSION  Right 09/04/2018  . TONSILLECTOMY   Past Family History: Family History  Problem Relation Age of Onset  . Coronary Artery Disease (Blocked arteries around heart) Mother  . High blood pressure (Hypertension) Mother  . Coronary Artery Disease (Blocked arteries around heart) Father  . High blood pressure (Hypertension) Father  . Myocardial Infarction (Heart attack) Father  . Coronary Artery Disease (Blocked arteries around heart) Brother 39  2 brothers   Medications: Current Outpatient Medications Ordered in Epic  Medication Sig Dispense Refill  . acetaminophen (TYLENOL) 500 MG tablet Take 1 tablet by mouth as needed  . amoxicillin (AMOXIL) 500 MG capsule (Patient not taking: Reported on 11/30/2020 )  . aspirin 81 MG EC tablet Take 81 mg by mouth once daily.  . benazepriL (LOTENSIN) 5 MG tablet Take 1 tablet by mouth once daily 90 tablet 3  . escitalopram oxalate (LEXAPRO) 10 MG tablet Take 1 tablet (10 mg total) by mouth once daily 90 tablet 3  . fluticasone propionate (FLONASE) 50 mcg/actuation nasal spray Place 2 sprays into both nostrils once daily 48 g 1  . hydroCHLOROthiazide (HYDRODIURIL) 25 MG tablet Take 1 tablet (25 mg total) by mouth once daily 90 tablet 3  . LORazepam (ATIVAN) 0.5 MG tablet Take 1 tablet (0.5 mg total) by mouth every 8 (eight) hours as needed for Anxiety or Sleep (Patient not taking: Reported on 11/30/2020 ) 10 tablet 0  . multivitamin capsule Take 1 capsule by mouth once daily.   No current Epic-ordered facility-administered medications on file.   Allergies: Allergies  Allergen Reactions  . Codeine Syncope    Review of Systems:  A  comprehensive 14 point ROS was performed, reviewed by me today, and the pertinent orthopaedic findings are documented in the HPI.  Exam: BP (!) 142/80  Ht 165.1 cm (5\' 5" )  Wt 69.3 kg (152 lb 12.8 oz)  BMI 25.43 kg/m  General/Constitutional: The patient appears to be well-nourished, well-developed, and in no acute  distress. Neuro/Psych: Normal mood and affect, oriented to person, place and time. Eyes: Non-icteric. Pupils are equal, round, and reactive to light, and exhibit synchronous movement. ENT: Unremarkable. Lymphatic: No palpable adenopathy. Respiratory: Lungs clear to auscultation, Normal chest excursion, No wheezes and Non-labored breathing Cardiovascular: Regular rate and rhythm. No murmurs. and No edema, swelling or tenderness, except as noted in detailed exam. Integumentary: No impressive skin lesions present, except as noted in detailed exam. Musculoskeletal: Unremarkable, except as noted in detailed exam.  The patient presents today in a sugar tong splint to the right upper extremity. Sugar tong splint has been loosened by the patient, new Ace wrap was applied prior to the patient leaving today. The patient is intact light touch of the dorsal and volar aspect of her fingers. She is able to flex and extend all digits without significant pain. Cap refills intact to each individual digit. The patient is intact to light touch to the proximal aspect of the splint as well around the right elbow.  Imaging: X-rays of the right wrist were reviewed at today's visit. Postreduction x-rays of the right wrist demonstrated improved alignment however there is still moderate dorsal angulation in addition to shortening of the distal radius. Ulnar styloid fracture is also visualized. No other acute fractures noted.  Impression: Other closed fracture of distal end of right radius, initial encounter [S52.591A] Other closed fracture of distal end of right radius, initial encounter (primary encounter diagnosis) Closed displaced fracture of styloid process of right ulna, initial encounter  Plan:  1. Treatment options were discussed today with the patient. 2. The patient has a displaced right distal radius fracture. 3. After discussion of the risk and benefits of surgical versus nonsurgical intervention, the  patient like to proceed with a open reduction internal fixation of the right distal radius. 4. Surgery will be scheduled with Dr. Rudene Christians on 12/03/2020. 5. This document will serve as a surgical history and physical for the patient. 6. The patient will follow-up per standard postop. They can call the clinic they have any questions, new symptoms develop or symptoms worsen.  The procedure was discussed with the patient, as were the potential risks (including bleeding, infection, nerve and/or blood vessel injury, persistent or recurrent pain, failure of the reduction, progression of arthritis, need for further surgery, blood clots, strokes, heart attacks and/or arhythmias, pneumonia, etc.) and benefits. The patient states her understanding and wishes to proceed.  This office visit took 45 minutes, of which >50% involved patient counseling/education.  Review of the [place] CSRS was performed in accordance of the [place] prior to dispensing any controlled drugs.  This note was generated in part with voice recognition software and I apologize for any typographical errors that were not detected and corrected.  Raquel James, PA-C [facility]   Electronically signed by Lattie Corns, PA at 12/01/2020 1:02 PM EST    Reviewed  H+P. No changes noted.

## 2020-12-03 NOTE — Transfer of Care (Signed)
Immediate Anesthesia Transfer of Care Note  Patient: Laurie Ryan  Procedure(s) Performed: OPEN REDUCTION INTERNAL FIXATION (ORIF) DISTAL RADIAL FRACTURE (Right Wrist)  Patient Location: PACU  Anesthesia Type:General  Level of Consciousness: awake and sedated  Airway & Oxygen Therapy: Patient Spontanous Breathing and Patient connected to face mask oxygen  Post-op Assessment: Report given to RN and Post -op Vital signs reviewed and stable  Post vital signs: Reviewed and stable  Last Vitals:  Vitals Value Taken Time  BP 165/78 12/03/20 1100  Temp 36.3 C 12/03/20 1059  Pulse 65 12/03/20 1101  Resp 15 12/03/20 1101  SpO2 100 % 12/03/20 1101  Vitals shown include unvalidated device data.  Last Pain:  Vitals:   12/03/20 0801  TempSrc: Tympanic  PainSc: 5          Complications: No complications documented.

## 2022-01-01 ENCOUNTER — Emergency Department (HOSPITAL_COMMUNITY): Payer: Medicare Other

## 2022-01-01 ENCOUNTER — Emergency Department (HOSPITAL_COMMUNITY)
Admission: EM | Admit: 2022-01-01 | Discharge: 2022-01-02 | Disposition: A | Discharge status: Home / Self Care | Payer: Medicare Other | Attending: Emergency Medicine | Admitting: Emergency Medicine

## 2022-01-01 DIAGNOSIS — S8991XA Unspecified injury of right lower leg, initial encounter: Secondary | ICD-10-CM | POA: Diagnosis not present

## 2022-01-01 DIAGNOSIS — W228XXA Striking against or struck by other objects, initial encounter: Secondary | ICD-10-CM | POA: Diagnosis not present

## 2022-01-01 DIAGNOSIS — Z79899 Other long term (current) drug therapy: Secondary | ICD-10-CM | POA: Insufficient documentation

## 2022-01-01 DIAGNOSIS — K0889 Other specified disorders of teeth and supporting structures: Secondary | ICD-10-CM | POA: Insufficient documentation

## 2022-01-01 DIAGNOSIS — T1490XA Injury, unspecified, initial encounter: Secondary | ICD-10-CM

## 2022-01-01 DIAGNOSIS — Z7982 Long term (current) use of aspirin: Secondary | ICD-10-CM | POA: Insufficient documentation

## 2022-01-01 DIAGNOSIS — M25561 Pain in right knee: Secondary | ICD-10-CM

## 2022-01-01 DIAGNOSIS — I10 Essential (primary) hypertension: Secondary | ICD-10-CM | POA: Diagnosis not present

## 2022-01-01 NOTE — ED Triage Notes (Signed)
Pt c/o R leg pain x "mos." Originally hit medial aspect of knee on bed frame, recurrent pain/issues since. States she's seen numerous doctors, had numerous scans, nothing has helped.

## 2022-01-02 NOTE — ED Notes (Signed)
RN reviewed discharge instructions with pt. Pt verbalized understanding and had no further questions. VSS upon discharge.  

## 2022-01-02 NOTE — ED Provider Notes (Signed)
[facility] EMERGENCY DEPARTMENT Provider Note   CSN: [id number] Arrival date & time: 01/01/22  2107     History  Chief Complaint  Patient presents with   Leg Pain    Laurie Ryan is a 71 y.o. female.  The history is provided by the patient and medical records.  Leg Pain Laurie Ryan is a 71 y.o. female who presents to the Emergency Department complaining of right knee pain. She presents to the ED accompanied by her friend for evaluation of right knee pain after bumping it on the corner of her bed two months ago.  Has seen two different doctors for evaluation.  Feels like a toothache.  Pain is located on the medial aspect of the right knee.  Had a joint injection two weeks ago at [facility] with no significant improvement in pain.  Painful to walk, pain on straightening knee.  Was treated with diclofenac and meloxicam without improvement in sxs.    No fever, cp, sob.  No hip pain.  No abdominal pain.    Has a hx/o HTN    Home Medications Prior to Admission medications   Medication Sig Start Date End Date Taking? Authorizing Provider  albuterol (PROVENTIL HFA;VENTOLIN HFA) 108 (90 Base) MCG/ACT inhaler Inhale 2 puffs into the lungs every 4 (four) hours as needed for wheezing or shortness of breath. 02/11/16   Paulette Blanch, MD  aspirin EC 81 MG tablet Take by mouth.    [provider]  benazepril (LOTENSIN) 5 MG tablet Take 5 mg by mouth daily.    [provider]  escitalopram (LEXAPRO) 10 MG tablet Take 10 mg by mouth daily.    [provider]  estradiol (ESTRACE) 0.1 MG/GM vaginal cream Place 1 Applicatorful vaginally at bedtime.    [provider]  fluticasone (FLONASE) 50 MCG/ACT nasal spray Place 2 sprays into both nostrils daily.    [provider]  hydrochlorothiazide (HYDRODIURIL) 25 MG tablet Take 25 mg by mouth daily.    [provider]  ibuprofen (ADVIL,MOTRIN) 600 MG tablet Take 1 tablet  (600 mg total) by mouth every 8 (eight) hours as needed. 01/17/19   Sable Feil, PA-C  meloxicam (MOBIC) 15 MG tablet Take 15 mg by mouth daily. 11/27/19   [provider]  Multiple Vitamin (MULTIVITAMIN) capsule Take 1 capsule by mouth daily.    [provider]  omeprazole (PRILOSEC) 20 MG capsule Take 20 mg by mouth daily.    [provider]      Allergies    Codeine    Review of Systems   Review of Systems  Physical Exam Updated Vital Signs BP (!) 163/91 (BP Location: Right Arm)    Pulse 85    Temp 98.4 F (36.9 C) (Oral)    Resp 19    SpO2 94%  Physical Exam Vitals and nursing note reviewed.  Constitutional:      Appearance: She is well-developed.  HENT:     Head: Normocephalic and atraumatic.  Cardiovascular:     Rate and Rhythm: Normal rate and regular rhythm.  Pulmonary:     Effort: Pulmonary effort is normal. No respiratory distress.  Abdominal:     Palpations: Abdomen is soft.     Tenderness: There is no abdominal tenderness. There is no guarding or rebound.  Musculoskeletal:     Comments: 2+ DP pulses bilaterally.  TTP over the medial joint line of the right knee with no significant soft  tissue swelling or erythema.  Flexion/extension intact at the knee.    Skin:    General: Skin is warm and dry.  Neurological:     Mental Status: She is alert and oriented to person, place, and time.  Psychiatric:        Behavior: Behavior normal.    ED Results / Procedures / Treatments   Labs (all labs ordered are listed, but only abnormal results are displayed) Labs Reviewed - No data to display  EKG None  Radiology DG Knee Complete 4 Views Right  Result Date: 01/01/2022 CLINICAL DATA:  Several months history of right lower extremity pain. EXAM: RIGHT KNEE - COMPLETE 4+ VIEW COMPARISON:  None. FINDINGS: There is mild osteopenia without evidence of fractures. Small to moderate suprapatellar bursal fluid. Mild narrowing and trace spurring noted  medial femorotibial joint without further findings of significant arthrosis. Slight enthesopathic spurring anterior superior patella. No radiopaque foreign body or superficial soft tissue abnormality is seen. IMPRESSION: Osteopenia and degenerative change without evidence of fracture. Suprapatellar bursal fluid. Electronically Signed   By: Telford Nab M.D.   On: 01/01/2022 21:45    Procedures Procedures    Medications Ordered in ED Medications - No data to display  ED Course/ Medical Decision Making/ A&P                           Medical Decision Making  Patient here for evaluation of 2 months of progressive right knee pain, had a remote trauma.  No new trauma.  No evidence of acute gouty or septic arthritis.  She is able to range the knee without difficulty.  Images are negative for acute fx/dislocation, personally reviewed.  Discussed with patient importance of orthopedics follow-up.  Will provide knee sleeve for comfort.  Discussed return precautions.          Final Clinical Impression(s) / ED Diagnoses Final diagnoses:  Injury    Rx / DC Orders ED Discharge Orders     None         Quintella Reichert, MD 01/02/22 [phone]

## 2022-01-03 ENCOUNTER — Other Ambulatory Visit: Payer: Self-pay | Admitting: Orthopedic Surgery

## 2022-01-03 DIAGNOSIS — S83249A Other tear of medial meniscus, current injury, unspecified knee, initial encounter: Secondary | ICD-10-CM

## 2022-01-07 ENCOUNTER — Other Ambulatory Visit: Payer: Medicare Other

## 2022-01-12 ENCOUNTER — Ambulatory Visit
Admission: RE | Admit: 2022-01-12 | Discharge: 2022-01-12 | Disposition: A | Discharge status: Home / Self Care | Payer: Medicare Other | Source: Ambulatory Visit | Attending: Orthopedic Surgery | Admitting: Orthopedic Surgery

## 2022-01-12 DIAGNOSIS — S83249A Other tear of medial meniscus, current injury, unspecified knee, initial encounter: Secondary | ICD-10-CM

## 2022-02-15 ENCOUNTER — Other Ambulatory Visit: Payer: Self-pay | Admitting: Family Medicine

## 2022-02-15 DIAGNOSIS — Z1231 Encounter for screening mammogram for malignant neoplasm of breast: Secondary | ICD-10-CM

## 2022-03-02 NOTE — Progress Notes (Signed)
Sent message, via epic in basket, requesting orders in epic from surgeon.  

## 2022-03-09 ENCOUNTER — Inpatient Hospital Stay (HOSPITAL_COMMUNITY)
Admission: RE | Admit: 2022-03-09 | Discharge: 2022-03-09 | Disposition: A | Discharge status: Home / Self Care | Payer: Medicare Other | Source: Ambulatory Visit

## 2022-03-10 NOTE — Patient Instructions (Addendum)
DUE TO COVID-19 ONLY TWO VISITORS  (aged 69 and older)  IS ALLOWED TO COME WITH YOU AND STAY IN THE WAITING ROOM ONLY DURING PRE OP AND PROCEDURE.   ?**NO VISITORS ARE ALLOWED IN THE SHORT STAY AREA OR RECOVERY ROOM!!** ? ? ?You are not required to quarantine ?Hand Hygiene often ?Do NOT share personal items ?Notify your provider if you are in close contact with someone who has COVID or you develop fever 100.4 or greater, new onset of sneezing, cough, sore throat, shortness of breath or body aches. ? ?     ? Your procedure is scheduled on:  03-22-22 ? ? Report to [facility] Main Entrance ? ?  Report to admitting at 8:00 AM ? ? Call this number if you have problems the morning of surgery [phone] ? ? Do not eat food :After Midnight. ? ? After Midnight you may have the following liquids until 7:45 AM DAY OF SURGERY ? ?Water ?Black Coffee (sugar ok, NO MILK/CREAM OR CREAMERS)  ?Tea (sugar ok, NO MILK/CREAM OR CREAMERS) regular and decaf                             ?Plain Jell-O (NO RED)                                           ?Fruit ices (not with fruit pulp, NO RED)                                     ?Popsicles (NO RED)                                                                  ?Juice: apple, WHITE grape, WHITE cranberry ?Sports drinks like Gatorade (NO RED) ?Clear broth(vegetable,chicken,beef) ?             ?  ?  ?The day of surgery:  ?Drink ONE (1) Pre-Surgery Clear Ensure at 7:45 AM the morning of surgery. Drink in one sitting. Do not sip.  ?This drink was given to you during your hospital  ?pre-op appointment visit. ?Nothing else to drink after completing the Pre-Surgery Clear Ensure . ?  ?       If you have questions, please contact your surgeon?s office. ? ? ?FOLLOW  ANY ADDITIONAL PRE OP INSTRUCTIONS YOU RECEIVED FROM YOUR SURGEON'S OFFICE!!! ?  ?  ?Oral Hygiene is also important to reduce your risk of infection.                                    ?Remember - BRUSH YOUR TEETH THE MORNING OF  SURGERY WITH YOUR REGULAR TOOTHPASTE ? ? Do NOT smoke after Midnight ? ? Take these medicines the morning of surgery with A SIP OF WATER: Escitalopram,.  Okay to use nasal spray ?                  ?  You may not have any metal on your body including hair pins, jewelry, and body piercing ? ?           Do not wear make-up, lotions, powders, perfumes or deodorant ? ?Do not wear nail polish including gel and S&S, artificial/acrylic nails, or any other type of covering on natural nails including finger and toenails. If you have artificial nails, gel coating, etc. that needs to be removed by a nail salon please have this removed prior to surgery or surgery may need to be canceled/ delayed if the surgeon/ anesthesia feels like they are unable to be safely monitored.  ? ?Do not shave  48 hours prior to surgery.  ? ? Do not bring valuables to the hospital. [place]. ? ? Contacts, dentures or bridgework may not be worn into surgery. ? ?Patients discharged on the day of surgery will not be allowed to drive home.  Someone NEEDS to stay with you for the first 24 hours after anesthesia. ? ?Please read over the following fact sheets you were given: IF [place]  ? ?[facility] - Preparing for Surgery ?Before surgery, you can play an important role.  Because skin is not sterile, your skin needs to be as free of germs as possible.  You can reduce the number of germs on your skin by washing with CHG (chlorahexidine gluconate) soap before surgery.  CHG is an antiseptic cleaner which kills germs and bonds with the skin to continue killing germs even after washing. ?Please DO NOT use if you have an allergy to CHG or antibacterial soaps.  If your skin becomes reddened/irritated stop using the CHG and inform your nurse when you arrive at Short Stay. ?Do not shave (including legs and underarms) for at least 48 hours prior to  the first CHG shower.  You may shave your face/neck. ? ?Please follow these instructions carefully: ? 1.  Shower with CHG Soap the night before surgery and the  morning of surgery. ? 2.  If you choose to wash your hair, wash your hair first as usual with your normal  shampoo. ? 3.  After you shampoo, rinse your hair and body thoroughly to remove the shampoo.                            ? 4.  Use CHG as you would any other liquid soap.  You can apply chg directly to the skin and wash.  Gently with a scrungie or clean washcloth. ? 5.  Apply the CHG Soap to your body ONLY FROM THE NECK DOWN.   Do   not use on face/ open      ?                     Wound or open sores. Avoid contact with eyes, ears mouth and   genitals (private parts).  ?                     Production manager,  Genitals (private parts) with your normal soap. ?            6.  Wash thoroughly, paying special attention to the area where your    surgery  will be performed. ? 7.  Thoroughly rinse your body with warm water from the neck down. ? 8.  DO NOT shower/wash with your normal soap after using and rinsing off the CHG Soap. ?               9.  Pat yourself dry with a clean towel. ?           10.  Wear clean pajamas. ?           11.  Place clean sheets on your bed the night of your first shower and do not  sleep with pets. ?Day of Surgery : ?Do not apply any lotions/deodorants the morning of surgery.  Please wear clean clothes to the hospital/surgery center. ? ?FAILURE TO FOLLOW THESE INSTRUCTIONS MAY RESULT IN THE CANCELLATION OF YOUR SURGERY ? ?PATIENT SIGNATURE_________________________________ ? ?NURSE SIGNATURE__________________________________ ? ?________________________________________________________________________  ?  ? ?Incentive Spirometer ? ?An incentive spirometer is a tool that can help keep your lungs clear and active. This tool measures how well you are filling your lungs with each breath. Taking long deep breaths may help reverse or decrease the  chance of developing breathing (pulmonary) problems (especially infection) following: ?A long period of time when you are unable to move or be active. ?BEFORE THE PROCEDURE  ?If the spirometer includes an indicator to show your best effort, your nurse or respiratory therapist will set it to a desired goal. ?If possible, sit up straight or lean slightly forward. Try not to slouch. ?Hold the incentive spirometer in an upright position. ?INSTRUCTIONS FOR USE  ?Sit on the edge of your bed if possible, or sit up as far as you can in bed or on a chair. ?Hold the incentive spirometer in an upright position. ?Breathe out normally. ?Place the mouthpiece in your mouth and seal your lips tightly around it. ?Breathe in slowly and as deeply as possible, raising the piston or the ball toward the top of the column. ?Hold your breath for 3-5 seconds or for as long as possible. Allow the piston or ball to fall to the bottom of the column. ?Remove the mouthpiece from your mouth and breathe out normally. ?Rest for a few seconds and repeat Steps 1 through 7 at least 10 times every 1-2 hours when you are awake. Take your time and take a few normal breaths between deep breaths. ?The spirometer may include an indicator to show your best effort. Use the indicator as a goal to work toward during each repetition. ?After each set of 10 deep breaths, practice coughing to be sure your lungs are clear. If you have an incision (the cut made at the time of surgery), support your incision when coughing by placing a pillow or rolled up towels firmly against it. ?Once you are able to get out of bed, walk around indoors and cough well. You may stop using the incentive spirometer when instructed by your caregiver.  ?RISKS AND COMPLICATIONS ?Take your time so you do not get dizzy or light-headed. ?If you are in pain, you may need to take or ask for pain medication before doing incentive spirometry. It is harder to take a deep breath if you are having  pain. ?AFTER USE ?Rest and breathe slowly and easily. ?It can be helpful to keep track of a log of your progress. Your caregiver can provide you with a simple table to help with this. ?If you are using

## 2022-03-10 NOTE — Progress Notes (Addendum)
COVID Vaccine Completed:  Yes X2 ?Date COVID Vaccine completed: ?Has received booster: No ?COVID vaccine manufacturer: Pfizer     ? ?Date of COVID positive in last 90 days:  No ? ?PCP - [place] Pink, MD ?Cardiologist - N/A ? ?Medical clearance on chart dated 02-04-22 by Dr. Kary Kos ? ?Chest x-ray -  N/A ?EKG - 02-02-22 On chart ?Stress Test -  N/A ?ECHO -  N/A ?Cardiac Cath -  N/A ?Pacemaker/ICD device last checked: ?Spinal Cord Stimulator: ? ?Bowel Prep - N/A ? ?Sleep Study - N/A ?CPAP -  ? ?Fasting Blood Sugar - N/A ?Checks Blood Sugar _____ times a day ? ?Blood Thinner Instructions: ?Aspirin Instructions:  ASA 81 mg.  To stop one week prior per patient ?Last Dose: ? ?Activity level:   Can go up a flight of stairs and perform activities of daily living without stopping and without symptoms of chest pain or shortness of breath.  Some limitations due to knee pain   ? ?Anesthesia review:  N/A ? ?Patient denies shortness of breath, fever, cough and chest pain at PAT appointment ? ?Patient verbalized understanding of instructions that were given to them at the PAT appointment. Patient was also instructed that they will need to review over the PAT instructions again at home before surgery.  ?

## 2022-03-15 ENCOUNTER — Encounter (HOSPITAL_COMMUNITY)
Admission: RE | Admit: 2022-03-15 | Discharge: 2022-03-15 | Disposition: A | Discharge status: Home / Self Care | Payer: Medicare Other | Source: Ambulatory Visit | Attending: Orthopedic Surgery | Admitting: Orthopedic Surgery

## 2022-03-15 ENCOUNTER — Other Ambulatory Visit: Payer: Self-pay

## 2022-03-15 ENCOUNTER — Encounter (HOSPITAL_COMMUNITY): Payer: Self-pay

## 2022-03-15 VITALS — BP 143/75 | HR 76 | Temp 98.3°F | Resp 18 | Ht 66.0 in | Wt 153.6 lb

## 2022-03-15 DIAGNOSIS — Z01812 Encounter for preprocedural laboratory examination: Secondary | ICD-10-CM | POA: Diagnosis present

## 2022-03-15 DIAGNOSIS — I2510 Atherosclerotic heart disease of native coronary artery without angina pectoris: Secondary | ICD-10-CM | POA: Insufficient documentation

## 2022-03-15 DIAGNOSIS — Z01818 Encounter for other preprocedural examination: Secondary | ICD-10-CM

## 2022-03-15 LAB — BASIC METABOLIC PANEL
Anion gap: 10 (ref 5–15)
BUN: 24 mg/dL — ABNORMAL HIGH (ref 8–23)
CO2: 25 mmol/L (ref 22–32)
Calcium: 9.1 mg/dL (ref 8.9–10.3)
Chloride: 104 mmol/L (ref 98–111)
Creatinine, Ser: 1.15 mg/dL — ABNORMAL HIGH (ref 0.44–1.00)
GFR, Estimated: 52 mL/min — ABNORMAL LOW (ref >60)
Glucose, Bld: 118 mg/dL — ABNORMAL HIGH (ref 70–99)
Potassium: 4 mmol/L (ref 3.5–5.1)
Sodium: 139 mmol/L (ref 135–145)

## 2022-03-15 LAB — CBC
HCT: 39.4 % (ref 36.0–46.0)
Hemoglobin: 13.1 g/dL (ref 12.0–15.0)
MCH: 32.1 pg (ref 26.0–34.0)
MCHC: 33.2 g/dL (ref 30.0–36.0)
MCV: 96.6 fL (ref 80.0–100.0)
Platelets: 202 10*3/uL (ref 150–400)
RBC: 4.08 MIL/uL (ref 3.87–5.11)
RDW: 12.3 % (ref 11.5–15.5)
WBC: 5.5 10*3/uL (ref 4.0–10.5)
nRBC: 0 % (ref 0.0–0.2)

## 2022-03-15 LAB — SURGICAL PCR SCREEN
MRSA, PCR: NEGATIVE
Staphylococcus aureus: POSITIVE — AB

## 2022-03-15 NOTE — Progress Notes (Signed)
PCR results sent to Dr. Landau to review. 

## 2022-03-21 NOTE — H&P (Signed)
KNEE ARTHROPLASTY ADMISSION H&P ? ?Patient ID: ?Laurie Ryan ?MRN: [id number] ?DOB/AGE: [birth year] 71 y.o. ? ?Chief Complaint: right knee pain. ? ?Planned Procedure Date: 03/22/22 ?Medical Clearance by Dr. Kary Kos ? ? ?HPI: ?Laurie Ryan is a 71 y.o. 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 The patient has a history of pain and functional disability in the right knee due to arthritis and has failed non-surgical conservative treatments for greater than 12 weeks to include NSAID's and/or analgesics, corticosteriod injections, and activity modification.  Onset of symptoms was abrupt, starting earlier this year with rapidlly worsening course since that time. The patient noted no past surgery on the right knee.  Patient currently rates pain at 6 out of 10 with activity. Patient has worsening of pain with activity and weight bearing and pain that interferes with activities of daily living.  Patient has evidence of joint space narrowing by imaging studies.  There is no active infection. ? ?Past Medical History:  ?Diagnosis Date  ? Arthritis   ? lower back, shoulders  ? Cancer [facility])   ? skin ca  ? Depression   ? Hypertension   ? ?Past Surgical History:  ?Procedure Laterality Date  ? BICEPT TENODESIS Right 09/04/2018  ? Procedure: BICEPS TENODESIS;  Surgeon: Leim Fabry, MD;  Location: [place];  Service: Orthopedics;  Laterality: Right;  ? CHOLECYSTECTOMY    ? COLONOSCOPY WITH PROPOFOL N/A 05/15/2015  ? Procedure: COLONOSCOPY WITH PROPOFOL;  Surgeon: Manya Silvas, MD;  Location: [facility] ENDOSCOPY;  Service: Endoscopy;  Laterality: N/A;  ? FOOT SURGERY    ? OPEN REDUCTION INTERNAL FIXATION (ORIF) DISTAL RADIAL FRACTURE Right 12/03/2020  ? Procedure: OPEN REDUCTION INTERNAL FIXATION (ORIF) DISTAL RADIAL FRACTURE;  Surgeon: Hessie Knows, MD;  Location: [facility] ORS;  Service: Orthopedics;  Laterality: Right;  ? SHOULDER ARTHROSCOPY Right 09/04/2018  ? Procedure: ARTHROSCOPY SHOULDER;  Surgeon:  Leim Fabry, MD;  Location: [place];  Service: Orthopedics;  Laterality: Right;  ? SHOULDER OPEN ROTATOR CUFF REPAIR Right 09/04/2018  ? Procedure: MINI OPEN ROTATOR CUFF REPAIR;  Surgeon: Leim Fabry, MD;  Location: [place];  Service: Orthopedics;  Laterality: Right;  ? TONSILLECTOMY    ? WISDOM TOOTH EXTRACTION    ? ?Allergies  ?Allergen Reactions  ? Codeine Other (See Comments)  ?  Passed out  ? ?Prior to Admission medications   ?Medication Sig Start Date End Date Taking? Authorizing Provider  ?aspirin EC 81 MG tablet Take 81 mg by mouth daily.   Yes [provider]  ?azelastine (ASTELIN) 0.1 % nasal spray Place 1 spray into the nose daily as needed for rhinitis. 01/05/21  Yes [provider]  ?benazepril (LOTENSIN) 5 MG tablet Take 5 mg by mouth daily.   Yes [provider]  ?escitalopram (LEXAPRO) 10 MG tablet Take 10 mg by mouth daily.   Yes [provider]  ?fluticasone (FLONASE) 50 MCG/ACT nasal spray Place 2 sprays into both nostrils daily as needed for allergies.   Yes [provider]  ?hydrochlorothiazide (HYDRODIURIL) 25 MG tablet Take 25 mg by mouth daily.   Yes [provider]  ?Multiple Vitamin (MULTIVITAMIN) capsule Take 1 capsule by mouth daily.   Yes [provider]  ?traMADol (ULTRAM) 50 MG tablet Take 50 mg by mouth 3 (three) times daily as needed for pain. 02/22/22  Yes [provider]  ? ?Social History  ? ?Socioeconomic History  ? Marital status: Widowed  ?  Spouse name: Not on  file  ? Number of children: Not on file  ? Years of education: Not on file  ? Highest education level: Not on file  ?Occupational History  ? Not on file  ?Tobacco Use  ? Smoking status: Never  ? Smokeless tobacco: Never  ?Vaping Use  ? Vaping Use: Never used  ?Substance and Sexual Activity  ? Alcohol use: No  ? Drug use: No  ? Sexual activity: Not on file  ?Other Topics Concern  ? Not on file  ?Social History Narrative  ? Not  on file  ? ?Social Determinants of Health  ? ?Financial Resource Strain: Not on file  ?Food Insecurity: Not on file  ?Transportation Needs: Not on file  ?Physical Activity: Not on file  ?Stress: Not on file  ?Social Connections: Not on file  ? ?Family History  ?Problem Relation Age of Onset  ? Heart disease Mother   ? Heart disease Brother   ? Breast cancer Neg Hx   ? ? ?ROS: Currently denies lightheadedness, dizziness, Fever, chills, CP, SOB.   ?No personal history of DVT, PE, MI, or CVA. ?No loose teeth or dentures ?All other systems have been reviewed and were otherwise currently negative with the exception of those mentioned in the HPI and as above. ? ?Objective: ?Vitals: Ht: '5\' 5"'$  Wt: 156.2 lbs Temp: 97.6 BP: 139/78 Pulse: 76 O2 96% on room air.   ?Physical Exam: ?General: Alert, NAD.  Antalgic Gait  ?HEENT: EOMI, Good Neck Extension  ?Pulm: No increased work of breathing.  Clear B/L A/P w/o crackle or wheeze.  ?CV: RRR, No m/g/r appreciated  ?GI: soft, NT, ND ?Neuro: Neuro without gross focal deficit.  Sensation intact distally ?Skin: No lesions in the area of chief complaint ?MSK/Surgical Site: right knee w/o redness or effusion.  medial JLT. ROM 0-120.  5/5 strength in extension and flexion.  +EHL/FHL.  NVI.  Stable varus and valgus stress.  ? ? ?Imaging Review ?Plain radiographs demonstrate severe anteromedial degenerative joint disease of the right knee.  ? ?The overall alignment issignificant varus. The bone quality appears to be adequate for age and reported activity level. ? ?Preoperative templating of the joint replacement has been completed, documented, and submitted to the Operating Room personnel in order to optimize intra-operative equipment management. ? ?Assessment: ?DJD RIGHT KNEE ?Active Problems: ?  * No active hospital problems. * ? ? ?Plan: ?Plan for Procedure(s): ?UNICOMPARTMENTAL KNEE ? ?The patient history, physical exam, clinical judgement of the provider and imaging are consistent with  end stage degenerative joint disease and unicompartmental joint arthroplasty is deemed medically necessary. The treatment options including medical management, injection therapy, and arthroplasty were discussed at length. The risks and benefits of Procedure(s): ?UNICOMPARTMENTAL KNEE were presented and reviewed.  ?The risks of nonoperative treatment, versus surgical intervention including but not limited to continued pain, aseptic loosening, stiffness, dislocation/subluxation, infection, bleeding, nerve injury, blood clots, cardiopulmonary complications, morbidity, mortality, among others were discussed. The patient verbalizes understanding and wishes to proceed with the plan.  ?Patient is being admitted for inpatient treatment for surgery, pain control, PT, prophylactic antibiotics, VTE prophylaxis, progressive ambulation, ADL's and discharge planning.  ? ?Dental prophylaxis discussed and recommended for 2 years postoperatively. ? ?The patient does meet the criteria for TXA which will be used perioperatively.   ?ASA 325 mg  will be used postoperatively for DVT prophylaxis in addition to SCDs, and early ambulation. ?The patient is planning to be discharged home with OPPT in care of friend ? ? ? ?  Ventura Bruns, PA-C ?03/21/2022 ?5:28 PM  ?

## 2022-03-22 ENCOUNTER — Encounter (HOSPITAL_COMMUNITY): Payer: Self-pay | Admitting: Orthopedic Surgery

## 2022-03-22 ENCOUNTER — Ambulatory Visit (HOSPITAL_BASED_OUTPATIENT_CLINIC_OR_DEPARTMENT_OTHER): Payer: Medicare Other | Admitting: Certified Registered"

## 2022-03-22 ENCOUNTER — Ambulatory Visit (HOSPITAL_COMMUNITY)
Admission: RE | Admit: 2022-03-22 | Discharge: 2022-03-23 | Disposition: A | Discharge status: Home / Self Care | Payer: Medicare Other | Attending: Orthopedic Surgery | Admitting: Orthopedic Surgery

## 2022-03-22 ENCOUNTER — Ambulatory Visit (HOSPITAL_COMMUNITY): Payer: Medicare Other | Admitting: Certified Registered"

## 2022-03-22 ENCOUNTER — Ambulatory Visit (HOSPITAL_COMMUNITY): Payer: Medicare Other

## 2022-03-22 ENCOUNTER — Other Ambulatory Visit: Payer: Self-pay

## 2022-03-22 ENCOUNTER — Encounter (HOSPITAL_COMMUNITY)
Admission: RE | Disposition: A | Discharge status: Home / Self Care | Payer: Self-pay | Source: Home / Self Care | Attending: Orthopedic Surgery

## 2022-03-22 DIAGNOSIS — M1711 Unilateral primary osteoarthritis, right knee: Secondary | ICD-10-CM | POA: Diagnosis present

## 2022-03-22 DIAGNOSIS — I10 Essential (primary) hypertension: Secondary | ICD-10-CM | POA: Diagnosis not present

## 2022-03-22 DIAGNOSIS — M25861 Other specified joint disorders, right knee: Secondary | ICD-10-CM

## 2022-03-22 DIAGNOSIS — Z96651 Presence of right artificial knee joint: Secondary | ICD-10-CM

## 2022-03-22 HISTORY — PX: PARTIAL KNEE ARTHROPLASTY: SHX2174

## 2022-03-22 SURGERY — ARTHROPLASTY, KNEE, UNICOMPARTMENTAL
Anesthesia: Spinal | Site: Knee | Laterality: Right

## 2022-03-22 MED ORDER — ESCITALOPRAM OXALATE 10 MG PO TABS
10.0000 mg | ORAL_TABLET | Freq: Every day | ORAL | Status: DC
  Administered 2022-03-22: 10 mg via ORAL
  Filled 2022-03-22: qty 1

## 2022-03-22 MED ORDER — DEXAMETHASONE SODIUM PHOSPHATE 10 MG/ML IJ SOLN
10.0000 mg | Freq: Once | INTRAMUSCULAR | Status: AC
  Administered 2022-03-23: 10 mg via INTRAVENOUS
  Filled 2022-03-22: qty 1

## 2022-03-22 MED ORDER — CEFAZOLIN SODIUM-DEXTROSE 2-4 GM/100ML-% IV SOLN
INTRAVENOUS | Status: AC
  Administered 2022-03-22: 2000 mg
  Filled 2022-03-22: qty 100

## 2022-03-22 MED ORDER — ACETAMINOPHEN 500 MG PO TABS
ORAL_TABLET | ORAL | Status: AC
  Filled 2022-03-22: qty 2

## 2022-03-22 MED ORDER — POVIDONE-IODINE 10 % EX SWAB: 2.0000 "application " | Freq: Once | CUTANEOUS | Status: DC

## 2022-03-22 MED ORDER — FLUTICASONE PROPIONATE 50 MCG/ACT NA SUSP: 2.0000 | Freq: Every day | NASAL | Status: DC | PRN

## 2022-03-22 MED ORDER — METHOCARBAMOL 500 MG PO TABS
500.0000 mg | ORAL_TABLET | Freq: Four times a day (QID) | ORAL | Status: DC | PRN
  Administered 2022-03-22: 500 mg via ORAL
  Filled 2022-03-22: qty 1

## 2022-03-22 MED ORDER — BUPIVACAINE HCL (PF) 0.25 % IJ SOLN
INTRAMUSCULAR | Status: AC
  Filled 2022-03-22: qty 30

## 2022-03-22 MED ORDER — ONDANSETRON HCL 4 MG PO TABS: 4.0000 mg | ORAL_TABLET | Freq: Three times a day (TID) | ORAL | 0 refills | Status: AC | PRN

## 2022-03-22 MED ORDER — DEXAMETHASONE SODIUM PHOSPHATE 10 MG/ML IJ SOLN
INTRAMUSCULAR | Status: AC
  Filled 2022-03-22: qty 1

## 2022-03-22 MED ORDER — BENAZEPRIL HCL 10 MG PO TABS
5.0000 mg | ORAL_TABLET | Freq: Every day | ORAL | Status: DC
  Administered 2022-03-22: 5 mg via ORAL
  Filled 2022-03-22: qty 1

## 2022-03-22 MED ORDER — ESCITALOPRAM OXALATE 10 MG PO TABS: 10.0000 mg | ORAL_TABLET | Freq: Every day | ORAL | Status: DC

## 2022-03-22 MED ORDER — POLYETHYLENE GLYCOL 3350 17 G PO PACK: 17.0000 g | PACK | Freq: Every day | ORAL | Status: DC | PRN

## 2022-03-22 MED ORDER — METOCLOPRAMIDE HCL 5 MG PO TABS: 5.0000 mg | ORAL_TABLET | Freq: Three times a day (TID) | ORAL | Status: DC | PRN

## 2022-03-22 MED ORDER — MIDAZOLAM HCL 2 MG/2ML IJ SOLN
1.0000 mg | INTRAMUSCULAR | Status: DC
  Administered 2022-03-22: 1 mg via INTRAVENOUS
  Filled 2022-03-22: qty 2

## 2022-03-22 MED ORDER — METOPROLOL TARTRATE 5 MG/5ML IV SOLN
5.0000 mg | Freq: Once | INTRAVENOUS | Status: AC
  Administered 2022-03-22: 5 mg via INTRAVENOUS

## 2022-03-22 MED ORDER — ONDANSETRON HCL 4 MG/2ML IJ SOLN: 4.0000 mg | Freq: Four times a day (QID) | INTRAMUSCULAR | Status: DC | PRN

## 2022-03-22 MED ORDER — ACETAMINOPHEN 500 MG PO TABS
1000.0000 mg | ORAL_TABLET | Freq: Four times a day (QID) | ORAL | Status: DC
  Administered 2022-03-22 – 2022-03-23 (×3): 1000 mg via ORAL
  Filled 2022-03-22 (×2): qty 2

## 2022-03-22 MED ORDER — BUPIVACAINE HCL 0.25 % IJ SOLN
INTRAMUSCULAR | Status: DC | PRN
  Administered 2022-03-22: 30 mL via INTRA_ARTICULAR

## 2022-03-22 MED ORDER — TRANEXAMIC ACID-NACL 1000-0.7 MG/100ML-% IV SOLN
INTRAVENOUS | Status: AC
Start: 2022-03-22 — End: 2022-03-23
  Filled 2022-03-22: qty 100

## 2022-03-22 MED ORDER — BENAZEPRIL HCL 5 MG PO TABS: 5.0000 mg | ORAL_TABLET | Freq: Every day | ORAL | Status: DC

## 2022-03-22 MED ORDER — ASPIRIN EC 325 MG PO TBEC
325.0000 mg | DELAYED_RELEASE_TABLET | Freq: Every day | ORAL | Status: DC
  Administered 2022-03-23: 325 mg via ORAL
  Filled 2022-03-22: qty 1

## 2022-03-22 MED ORDER — PROPOFOL 10 MG/ML IV BOLUS
INTRAVENOUS | Status: AC
  Filled 2022-03-22: qty 20

## 2022-03-22 MED ORDER — LACTATED RINGERS IV BOLUS
500.0000 mL | Freq: Once | INTRAVENOUS | Status: AC
  Administered 2022-03-22: 500 mL via INTRAVENOUS

## 2022-03-22 MED ORDER — 0.9 % SODIUM CHLORIDE (POUR BTL) OPTIME
TOPICAL | Status: DC | PRN
  Administered 2022-03-22: 1000 mL

## 2022-03-22 MED ORDER — ONDANSETRON HCL 4 MG/2ML IJ SOLN
INTRAMUSCULAR | Status: DC | PRN
  Administered 2022-03-22: 4 mg via INTRAVENOUS

## 2022-03-22 MED ORDER — AZELASTINE HCL 0.1 % NA SOLN: 1.0000 | Freq: Every day | NASAL | Status: DC | PRN

## 2022-03-22 MED ORDER — CHLORHEXIDINE GLUCONATE 0.12 % MT SOLN
15.0000 mL | Freq: Once | OROMUCOSAL | Status: AC
  Administered 2022-03-22: 15 mL via OROMUCOSAL

## 2022-03-22 MED ORDER — PROPOFOL 500 MG/50ML IV EMUL
INTRAVENOUS | Status: DC | PRN
Start: 2022-03-22 — End: 2022-03-22
  Administered 2022-03-22: 100 ug/kg/min via INTRAVENOUS

## 2022-03-22 MED ORDER — HYDROCHLOROTHIAZIDE 25 MG PO TABS
25.0000 mg | ORAL_TABLET | Freq: Every day | ORAL | Status: DC
Start: 2022-03-23 — End: 2022-03-22

## 2022-03-22 MED ORDER — CEFAZOLIN SODIUM-DEXTROSE 2-4 GM/100ML-% IV SOLN
2.0000 g | INTRAVENOUS | Status: AC
  Administered 2022-03-22: 2 g via INTRAVENOUS
  Filled 2022-03-22: qty 100

## 2022-03-22 MED ORDER — LACTATED RINGERS IV BOLUS: 250.0000 mL | Freq: Once | INTRAVENOUS | Status: DC

## 2022-03-22 MED ORDER — DEXAMETHASONE SODIUM PHOSPHATE 10 MG/ML IJ SOLN
INTRAMUSCULAR | Status: DC | PRN
Start: 2022-03-22 — End: 2022-03-22
  Administered 2022-03-22: 8 mg via INTRAVENOUS

## 2022-03-22 MED ORDER — ORAL CARE MOUTH RINSE: 15.0000 mL | Freq: Once | OROMUCOSAL | Status: AC

## 2022-03-22 MED ORDER — HYDROMORPHONE HCL 1 MG/ML IJ SOLN: 0.5000 mg | INTRAMUSCULAR | Status: DC | PRN

## 2022-03-22 MED ORDER — ONDANSETRON HCL 4 MG PO TABS: 4.0000 mg | ORAL_TABLET | Freq: Four times a day (QID) | ORAL | Status: DC | PRN

## 2022-03-22 MED ORDER — HYDROCHLOROTHIAZIDE 25 MG PO TABS
25.0000 mg | ORAL_TABLET | Freq: Every day | ORAL | Status: DC
Start: 2022-03-22 — End: 2022-03-23
  Administered 2022-03-22: 25 mg via ORAL
  Filled 2022-03-22: qty 1

## 2022-03-22 MED ORDER — BISACODYL 10 MG RE SUPP: 10.0000 mg | Freq: Every day | RECTAL | Status: DC | PRN

## 2022-03-22 MED ORDER — STERILE WATER FOR IRRIGATION IR SOLN
Status: DC | PRN
  Administered 2022-03-22: 2000 mL

## 2022-03-22 MED ORDER — OXYCODONE HCL 5 MG PO TABS: 5.0000 mg | ORAL_TABLET | ORAL | 0 refills | Status: DC | PRN

## 2022-03-22 MED ORDER — BUPIVACAINE IN DEXTROSE 0.75-8.25 % IT SOLN
INTRATHECAL | Status: DC | PRN
  Administered 2022-03-22: 1.6 mL via INTRATHECAL

## 2022-03-22 MED ORDER — FENTANYL CITRATE PF 50 MCG/ML IJ SOSY
50.0000 ug | PREFILLED_SYRINGE | INTRAMUSCULAR | Status: DC
  Administered 2022-03-22: 50 ug via INTRAVENOUS
  Filled 2022-03-22: qty 2

## 2022-03-22 MED ORDER — OXYCODONE HCL 5 MG PO TABS
5.0000 mg | ORAL_TABLET | ORAL | Status: DC | PRN
  Administered 2022-03-22 (×2): 5 mg via ORAL
  Filled 2022-03-22: qty 1
  Filled 2022-03-22: qty 2

## 2022-03-22 MED ORDER — DIPHENHYDRAMINE HCL 12.5 MG/5ML PO ELIX: 12.5000 mg | ORAL_SOLUTION | ORAL | Status: DC | PRN

## 2022-03-22 MED ORDER — SENNA-DOCUSATE SODIUM 8.6-50 MG PO TABS: 2.0000 | ORAL_TABLET | Freq: Every day | ORAL | 1 refills | Status: AC

## 2022-03-22 MED ORDER — ROPIVACAINE HCL 5 MG/ML IJ SOLN
INTRAMUSCULAR | Status: DC | PRN
  Administered 2022-03-22: 30 mL via PERINEURAL

## 2022-03-22 MED ORDER — PHENOL 1.4 % MT LIQD: 1.0000 | OROMUCOSAL | Status: DC | PRN

## 2022-03-22 MED ORDER — POVIDONE-IODINE 10 % EX SWAB
2.0000 "application " | Freq: Once | CUTANEOUS | Status: AC
  Administered 2022-03-22: 2 via TOPICAL

## 2022-03-22 MED ORDER — MENTHOL 3 MG MT LOZG: 1.0000 | LOZENGE | OROMUCOSAL | Status: DC | PRN

## 2022-03-22 MED ORDER — ACETAMINOPHEN 500 MG PO TABS
1000.0000 mg | ORAL_TABLET | Freq: Once | ORAL | Status: AC
  Administered 2022-03-22: 1000 mg via ORAL
  Filled 2022-03-22: qty 2

## 2022-03-22 MED ORDER — LACTATED RINGERS IV BOLUS
250.0000 mL | Freq: Once | INTRAVENOUS | Status: AC
  Administered 2022-03-22: 250 mL via INTRAVENOUS

## 2022-03-22 MED ORDER — ASPIRIN EC 325 MG PO TBEC: 325.0000 mg | DELAYED_RELEASE_TABLET | Freq: Two times a day (BID) | ORAL | 0 refills | Status: AC

## 2022-03-22 MED ORDER — OXYCODONE HCL 5 MG PO TABS: 10.0000 mg | ORAL_TABLET | ORAL | Status: DC | PRN

## 2022-03-22 MED ORDER — METOCLOPRAMIDE HCL 5 MG/ML IJ SOLN: 5.0000 mg | Freq: Three times a day (TID) | INTRAMUSCULAR | Status: DC | PRN

## 2022-03-22 MED ORDER — KETOROLAC TROMETHAMINE 30 MG/ML IJ SOLN
INTRAMUSCULAR | Status: AC
  Filled 2022-03-22: qty 1

## 2022-03-22 MED ORDER — ACETAMINOPHEN 325 MG PO TABS: 325.0000 mg | ORAL_TABLET | Freq: Four times a day (QID) | ORAL | Status: DC | PRN

## 2022-03-22 MED ORDER — LACTATED RINGERS IV SOLN
INTRAVENOUS | Status: DC
  Administered 2022-03-22: 1000 mL via INTRAVENOUS

## 2022-03-22 MED ORDER — POVIDONE-IODINE 7.5 % EX SOLN: Freq: Once | CUTANEOUS | Status: DC

## 2022-03-22 MED ORDER — ONDANSETRON HCL 4 MG/2ML IJ SOLN
INTRAMUSCULAR | Status: AC
  Filled 2022-03-22: qty 2

## 2022-03-22 MED ORDER — TRANEXAMIC ACID-NACL 1000-0.7 MG/100ML-% IV SOLN
1000.0000 mg | Freq: Once | INTRAVENOUS | Status: AC
  Administered 2022-03-22: 1000 mg via INTRAVENOUS

## 2022-03-22 MED ORDER — ALUM & MAG HYDROXIDE-SIMETH 200-200-20 MG/5ML PO SUSP: 30.0000 mL | ORAL | Status: DC | PRN

## 2022-03-22 MED ORDER — FLEET ENEMA 7-19 GM/118ML RE ENEM: 1.0000 | ENEMA | Freq: Once | RECTAL | Status: DC | PRN

## 2022-03-22 MED ORDER — METHOCARBAMOL 500 MG IVPB - SIMPLE MED: 500.0000 mg | Freq: Four times a day (QID) | INTRAVENOUS | Status: DC | PRN

## 2022-03-22 MED ORDER — DOCUSATE SODIUM 100 MG PO CAPS
100.0000 mg | ORAL_CAPSULE | Freq: Two times a day (BID) | ORAL | Status: DC
  Administered 2022-03-22 – 2022-03-23 (×2): 100 mg via ORAL
  Filled 2022-03-22 (×2): qty 1

## 2022-03-22 MED ORDER — PROPOFOL 1000 MG/100ML IV EMUL
INTRAVENOUS | Status: AC
  Filled 2022-03-22: qty 100

## 2022-03-22 MED ORDER — KETOROLAC TROMETHAMINE 30 MG/ML IJ SOLN
INTRAMUSCULAR | Status: DC | PRN
  Administered 2022-03-22: 30 mg via INTRA_ARTICULAR

## 2022-03-22 MED ORDER — TRANEXAMIC ACID-NACL 1000-0.7 MG/100ML-% IV SOLN
1000.0000 mg | INTRAVENOUS | Status: AC
  Administered 2022-03-22: 1000 mg via INTRAVENOUS
  Filled 2022-03-22: qty 100

## 2022-03-22 MED ORDER — METOPROLOL TARTRATE 5 MG/5ML IV SOLN
INTRAVENOUS | Status: AC
Start: 2022-03-22 — End: 2022-03-23
  Filled 2022-03-22: qty 5

## 2022-03-22 MED ORDER — CEFAZOLIN SODIUM-DEXTROSE 2-4 GM/100ML-% IV SOLN
2.0000 g | Freq: Four times a day (QID) | INTRAVENOUS | Status: AC
  Administered 2022-03-22 – 2022-03-23 (×2): 2 g via INTRAVENOUS
  Filled 2022-03-22 (×2): qty 100

## 2022-03-22 SURGICAL SUPPLY — 67 items
BAG COUNTER SPONGE SURGICOUNT (BAG) IMPLANT
BAG ZIPLOCK 12X15 (MISCELLANEOUS) ×2 IMPLANT
BANDAGE ESMARK 6X9 LF (GAUZE/BANDAGES/DRESSINGS) ×1 IMPLANT
BEARING TIB MENISCAL RT SM 4MM (Joint) IMPLANT
BLADE SURG 15 STRL LF DISP TIS (BLADE) ×1 IMPLANT
BLADE SURG 15 STRL SS (BLADE) ×1
BNDG ELASTIC 6X15 VLCR STRL LF (GAUZE/BANDAGES/DRESSINGS) ×2 IMPLANT
BNDG ESMARK 6X9 LF (GAUZE/BANDAGES/DRESSINGS) ×2
BOWL SMART MIX CTS (DISPOSABLE) ×2 IMPLANT
CEMENT BONE R 1X40 (Cement) ×2 IMPLANT
CLSR STERI-STRIP ANTIMIC 1/2X4 (GAUZE/BANDAGES/DRESSINGS) ×2 IMPLANT
COMPONENT TIBIAL OXFRD MEDL RT (Orthopedic Implant) IMPLANT
COVER SURGICAL LIGHT HANDLE (MISCELLANEOUS) ×2 IMPLANT
CUFF TOURN SGL QUICK 34 (TOURNIQUET CUFF) ×1
CUFF TRNQT CYL 34X4.125X (TOURNIQUET CUFF) ×1 IMPLANT
DRAPE EXTREMITY T 121X128X90 (DISPOSABLE) ×2 IMPLANT
DRAPE POUCH INSTRU U-SHP 10X18 (DRAPES) ×2 IMPLANT
DRAPE SHEET LG 3/4 BI-LAMINATE (DRAPES) ×2 IMPLANT
DRAPE U-SHAPE 47X51 STRL (DRAPES) ×2 IMPLANT
DRSG MEPILEX BORDER 4X8 (GAUZE/BANDAGES/DRESSINGS) ×2 IMPLANT
DRSG PAD ABDOMINAL 8X10 ST (GAUZE/BANDAGES/DRESSINGS) ×2 IMPLANT
DURAPREP 26ML APPLICATOR (WOUND CARE) ×4 IMPLANT
ELECT REM PT RETURN 15FT ADLT (MISCELLANEOUS) ×2 IMPLANT
FACESHIELD WRAPAROUND (MASK) ×2 IMPLANT
FACESHIELD WRAPAROUND OR TEAM (MASK) ×1 IMPLANT
GLOVE BIO SURGEON STRL SZ7 (GLOVE) ×2 IMPLANT
GLOVE BIOGEL PI IND STRL 7.0 (GLOVE) ×1 IMPLANT
GLOVE BIOGEL PI IND STRL 8 (GLOVE) ×1 IMPLANT
GLOVE BIOGEL PI INDICATOR 7.0 (GLOVE) ×1
GLOVE BIOGEL PI INDICATOR 8 (GLOVE) ×1
GLOVE SURG POLYISO LF SZ7.5 (GLOVE) ×2 IMPLANT
GOWN STRL REUS W/ TWL LRG LVL3 (GOWN DISPOSABLE) ×2 IMPLANT
GOWN STRL REUS W/TWL LRG LVL3 (GOWN DISPOSABLE) ×2
HANDPIECE INTERPULSE COAX TIP (DISPOSABLE) ×1
HOLDER FOLEY CATH W/STRAP (MISCELLANEOUS) IMPLANT
HOOD PEEL AWAY FLYTE STAYCOOL (MISCELLANEOUS) ×4 IMPLANT
IMMOBILIZER KNEE 20 (SOFTGOODS) ×2
IMMOBILIZER KNEE 20 THIGH 36 (SOFTGOODS) IMPLANT
IMMOBILIZER KNEE 22 UNIV (SOFTGOODS) IMPLANT
KIT BASIN OR (CUSTOM PROCEDURE TRAY) ×2 IMPLANT
KIT TURNOVER KIT A (KITS) IMPLANT
NDL SAFETY ECLIPSE 18X1.5 (NEEDLE) ×1 IMPLANT
NEEDLE HYPO 18GX1.5 SHARP (NEEDLE) ×1
NS IRRIG 1000ML POUR BTL (IV SOLUTION) ×2 IMPLANT
PACK BLADE SAW RECIP 70 3 PT (BLADE) ×1 IMPLANT
PACK TOTAL JOINT (CUSTOM PROCEDURE TRAY) ×2 IMPLANT
PEG FEMORAL PEGGED STRL SM (Knees) ×1 IMPLANT
PROTECTOR NERVE ULNAR (MISCELLANEOUS) ×2 IMPLANT
SET HNDPC FAN SPRY TIP SCT (DISPOSABLE) ×1 IMPLANT
SPIKE FLUID TRANSFER (MISCELLANEOUS) IMPLANT
STRIP CLOSURE SKIN 1/2X4 (GAUZE/BANDAGES/DRESSINGS) ×1 IMPLANT
SUCTION FRAZIER HANDLE 12FR (TUBING) ×1
SUCTION TUBE FRAZIER 12FR DISP (TUBING) ×1 IMPLANT
SUT VIC AB 1 CT1 36 (SUTURE) ×2 IMPLANT
SUT VIC AB 2-0 CT1 27 (SUTURE) ×1
SUT VIC AB 2-0 CT1 TAPERPNT 27 (SUTURE) ×1 IMPLANT
SUT VIC AB 3-0 SH 8-18 (SUTURE) ×2 IMPLANT
SYR 30ML LL (SYRINGE) ×2 IMPLANT
SYR 3ML LL SCALE MARK (SYRINGE) ×2 IMPLANT
TIBIAL OXFORD MEDIAL RT (Orthopedic Implant) ×2 IMPLANT
TOWEL OR 17X26 10 PK STRL BLUE (TOWEL DISPOSABLE) ×2 IMPLANT
TOWEL OR NON WOVEN STRL DISP B (DISPOSABLE) ×2 IMPLANT
TRAY FOLEY MTR SLVR 16FR STAT (SET/KITS/TRAYS/PACK) ×2 IMPLANT
TUBE SUCTION HIGH CAP CLEAR NV (SUCTIONS) ×2 IMPLANT
UNICOMPARTMENTAL KNEE MENISCAL (Joint) ×2 IMPLANT
WATER STERILE IRR 1000ML POUR (IV SOLUTION) ×2 IMPLANT
WRAP KNEE MAXI GEL POST OP (GAUZE/BANDAGES/DRESSINGS) ×2 IMPLANT

## 2022-03-22 NOTE — Plan of Care (Signed)
  Problem: Nutrition: Goal: Adequate nutrition will be maintained Outcome: Progressing   Problem: Pain Managment: Goal: General experience of comfort will improve Outcome: Progressing   Problem: Safety: Goal: Ability to remain free from injury will improve Outcome: Progressing   

## 2022-03-22 NOTE — Anesthesia Procedure Notes (Signed)
Spinal ? ?Patient location during procedure: OR ?Start time: 03/22/2022 10:48 AM ?End time: 03/22/2022 10:53 AM ?Reason for block: surgical anesthesia ?Staffing ?Performed: resident/CRNA  ?Resident/CRNA: Niel Hummer, CRNA ?Preanesthetic Checklist ?Completed: patient identified, IV checked, risks and benefits discussed, surgical consent, monitors and equipment checked, pre-op evaluation and timeout performed ?Spinal Block ?Patient position: sitting ?Prep: DuraPrep ?Patient monitoring: heart rate, blood pressure, continuous pulse ox and cardiac monitor ?Approach: midline ?Location: L3-4 ?Injection technique: single-shot ?Needle ?Needle type: Pencan  ?Needle gauge: 24 G ?Needle length: 10 cm ?Additional Notes ?Expiration date of kit checked. Clear CSF flow and aspiration prior to injection. Dr. Smith Robert present.  ? ? ? ?

## 2022-03-22 NOTE — Transfer of Care (Signed)
Immediate Anesthesia Transfer of Care Note ? ?Patient: Laurie Ryan ? ?Procedure(s) Performed: UNICOMPARTMENTAL KNEE (Right: Knee) ? ?Patient Location: PACU ? ?Anesthesia Type:Spinal ? ?Level of Consciousness: drowsy ? ?Airway & Oxygen Therapy: Patient Spontanous Breathing and Patient connected to face mask oxygen ? ?Post-op Assessment: Report given to RN and Post -op Vital signs reviewed and stable ? ?Post vital signs: Reviewed and stable ? ?Last Vitals:  ?Vitals Value Taken Time  ?BP 117/64   ?Temp    ?Pulse 67 03/22/22 1300  ?Resp 16 03/22/22 1300  ?SpO2 100 % 03/22/22 1300  ?Vitals shown include unvalidated device data. ? ?Last Pain:  ?Vitals:  ? 03/22/22 0846  ?TempSrc:   ?PainSc: 0-No pain  ?   ? ?  ? ?Complications: No notable events documented. ?

## 2022-03-22 NOTE — Anesthesia Postprocedure Evaluation (Signed)
Anesthesia Post Note ? ?Patient: Laurie Ryan ? ?Procedure(s) Performed: UNICOMPARTMENTAL KNEE (Right: Knee) ? ?  ? ?Patient location during evaluation: PACU ?Anesthesia Type: Spinal ?Level of consciousness: oriented and awake and alert ?Pain management: pain level controlled ?Vital Signs Assessment: post-procedure vital signs reviewed and stable ?Respiratory status: spontaneous breathing, respiratory function stable and patient connected to nasal cannula oxygen ?Cardiovascular status: blood pressure returned to baseline and stable ?Postop Assessment: no headache, no backache, no apparent nausea or vomiting and spinal receding ?Anesthetic complications: no ? ? ?No notable events documented. ? ?Last Vitals:  ?Vitals:  ? 03/22/22 1515 03/22/22 1530  ?BP: 140/61   ?Pulse: 73 76  ?Resp:  16  ?Temp:    ?SpO2: 93% 96%  ?  ?Last Pain:  ?Vitals:  ? 03/22/22 1530  ?TempSrc:   ?PainSc: 0-No pain  ? ? ?  ?  ?  ?  ?  ?  ? ?Effie Berkshire ? ? ? ? ?

## 2022-03-22 NOTE — Anesthesia Preprocedure Evaluation (Addendum)
Anesthesia Evaluation  ?Patient identified by MRN, date of birth, ID band ?Patient awake ? ? ? ?Reviewed: ?Allergy & Precautions, NPO status , Patient's Chart, lab work & pertinent test results ? ?Airway ?Mallampati: II ? ?TM Distance: >3 FB ?Neck ROM: Full ? ? ? Dental ? ?(+) Teeth Intact, Dental Advisory Given ?  ?Pulmonary ?neg pulmonary ROS,  ?  ?breath sounds clear to auscultation ? ? ? ? ? ? Cardiovascular ?hypertension,  ?Rhythm:Regular Rate:Normal ? ? ?  ?Neuro/Psych ?PSYCHIATRIC DISORDERS Depression negative neurological ROS ?   ? GI/Hepatic ?negative GI ROS, Neg liver ROS,   ?Endo/Other  ?negative endocrine ROS ? Renal/GU ?negative Renal ROS  ? ?  ?Musculoskeletal ? ?(+) Arthritis ,  ? Abdominal ?Normal abdominal exam  (+)   ?Peds ? Hematology ?negative hematology ROS ?(+)   ?Anesthesia Other Findings ? ? Reproductive/Obstetrics ? ?  ? ? ? ? ? ? ? ? ? ? ? ? ? ?  ?  ? ? ? ? ? ? ? ?Anesthesia Physical ?Anesthesia Plan ? ?ASA: 2 ? ?Anesthesia Plan: Spinal  ? ?Post-op Pain Management: Regional block*  ? ?Induction: Intravenous ? ?PONV Risk Score and Plan: 3 and Ondansetron, Dexamethasone and Propofol infusion ? ?Airway Management Planned: Simple Face Mask and Natural Airway ? ?Additional Equipment: None ? ?Intra-op Plan:  ? ?Post-operative Plan:  ? ?Informed Consent: I have reviewed the patients History and Physical, chart, labs and discussed the procedure including the risks, benefits and alternatives for the proposed anesthesia with the patient or authorized representative who has indicated his/her understanding and acceptance.  ? ? ? ? ? ?Plan Discussed with: CRNA ? ?Anesthesia Plan Comments: (Lab Results ?     Component                Value               Date                 ?     WBC                      5.5                 03/15/2022           ?     HGB                      13.1                03/15/2022           ?     HCT                      39.4                 03/15/2022           ?     MCV                      96.6                03/15/2022           ?     PLT                      202  03/15/2022           ?)  ? ? ? ? ? ?Anesthesia Quick Evaluation ? ?

## 2022-03-22 NOTE — Evaluation (Signed)
Physical Therapy Evaluation ?Patient Details ?Name: Laurie Ryan ?MRN: [id number] ?DOB: Dec 11, 1950 ?Today's Date: 03/22/2022 ? ?History of Present Illness ? Pt is a 70yo female presenting s/p R-unicompartmental knee on 03/22/22. PMH: OA, depression, HTN, R-RCR 2019, R-ORIF distal radius rx 2022 ?  ?Clinical Impression ? Laurie Ryan is a 71 y.o. female POD 0 s/p R-uni knee evaluated in PACU for potential same-day discharge. Patient reports independence with mobility at baseline. Patient is now limited by functional impairments (see PT problem list below) and requires min guard for bed mobility and min assist for sit to stand transfer. Upon initiation of ambulation, pt unable to weightbear after two steps and RLE buckled into LOB to the right, requiring max assist to transfer back to sitting position in bed via gait belt, pt reporting dizziness with elevated BP (full description in gait box below), RN aware. Further mobility deferred, pt returned to supine positioning. Patient will benefit from continued skilled PT interventions to address impairments and progress towards PLOF. Acute PT will follow to progress mobility and stair training in preparation for safe discharge home.   ? ?   ? ?Recommendations for follow up therapy are one component of a multi-disciplinary discharge planning process, led by the attending physician.  Recommendations may be updated based on patient status, additional functional criteria and insurance authorization. ? ?Follow Up Recommendations Follow physician's recommendations for discharge plan and follow up therapies ? ?  ?Assistance Recommended at Discharge Intermittent Supervision/Assistance  ?Patient can return home with the following ? A little help with walking and/or transfers;A little help with bathing/dressing/bathroom;Assistance with cooking/housework;Assist for transportation;Help with stairs or ramp for entrance ? ?  ?Equipment Recommendations Rolling walker (2 wheels)   ?Recommendations for Other Services ?    ?  ?Functional Status Assessment Patient has had a recent decline in their functional status and demonstrates the ability to make significant improvements in function in a reasonable and predictable amount of time.  ? ?  ?Precautions / Restrictions Precautions ?Precautions: None ?Required Braces or Orthoses: Knee Immobilizer - Right ?Restrictions ?Weight Bearing Restrictions: No ?Other Position/Activity Restrictions: WBAT  ? ?  ? ?Mobility ? Bed Mobility ?Overal bed mobility: Needs Assistance ?Bed Mobility: Supine to Sit ?  ?  ?Supine to sit: Min guard ?  ?  ?General bed mobility comments: Pt min guard for safety, no physical assist req. ?  ? ?Transfers ?Overall transfer level: Needs assistance ?Equipment used: Rolling walker (2 wheels) ?Transfers: Sit to/from Stand ?Sit to Stand: Min assist ?  ?  ?  ?  ?  ?General transfer comment: Pt min assist for steadying of RW, multimodal cues for sequencing and powering up through LLE and BUEs. Upon standing, pt able to do several reps of marching, WB in static stance. ?  ? ?Ambulation/Gait ?Ambulation/Gait assistance: Max assist ?Gait Distance (Feet): 2 Feet ?Assistive device: Rolling walker (2 wheels) ?Gait Pattern/deviations: Antalgic, Leaning posteriorly ?  ?  ?  ?General Gait Details: After taking initial forward step and upon forward progression of RLE pt unable to weightbear through RLE and buckled, required Max assist to bring back to seated position in bed. Pt reporting dizziness, RN arrived in PACU bay. BP in sitting 212/91 (pt moving arm, likely artifact). BP taken again 191/91. Pt returned to supine positioning, still reporting dizziness, RN notified MD. Further mobility deferred. ? ?Stairs ?  ?  ?  ?  ?  ? ?Wheelchair Mobility ?  ? ?Modified Rankin (Stroke Patients Only) ?  ? ?  ? ?  Balance Overall balance assessment: Needs assistance ?Sitting-balance support: Feet supported, No upper extremity supported ?Sitting  balance-Leahy Scale: Fair ?  ?  ?Standing balance support: Reliant on assistive device for balance, During functional activity, Bilateral upper extremity supported ?Standing balance-Leahy Scale: Zero ?Standing balance comment: Pt unable to maintain WB after taking initial step. ?  ?  ?  ?  ?  ?  ?  ?  ?  ?  ?  ?   ? ? ? ?Pertinent Vitals/Pain Pain Assessment ?Pain Assessment: No/denies pain  ? ? ?Home Living Family/patient expects to be discharged to:: Private residence ?Living Arrangements: Alone ?Available Help at Discharge: Friend(s);Available 24 hours/day Laurie Ryan) ?Type of Home: House ?Home Access: Stairs to enter ?Entrance Stairs-Rails: Can reach both;Left;Right ?Entrance Stairs-Number of Steps: 3 ?  ?Home Layout: One level ?Home Equipment: Shower seat ?   ?  ?Prior Function Prior Level of Function : Independent/Modified Independent ?  ?  ?  ?  ?  ?  ?Mobility Comments: ind ?ADLs Comments: ind ?  ? ? ?Hand Dominance  ?   ? ?  ?Extremity/Trunk Assessment  ?   ?  ? ?Lower Extremity Assessment ?Lower Extremity Assessment: RLE deficits/detail;LLE deficits/detail ?RLE Deficits / Details: MMT ank DF/PF 5/5, no extensor lag noted. Pt reports she can activate glutes. ?RLE Sensation: WNL ?LLE Deficits / Details: MMT ank DF/PF 5/5. Pt reports she can activate glutes. ?LLE Sensation: WNL ?  ? ?Cervical / Trunk Assessment ?Cervical / Trunk Assessment: Kyphotic  ?Communication  ? Communication: No difficulties  ?Cognition Arousal/Alertness: Awake/alert ?Behavior During Therapy: [facility] for tasks assessed/performed ?Overall Cognitive Status: Within Functional Limits for tasks assessed ?  ?  ?  ?  ?  ?  ?  ?  ?  ?  ?  ?  ?  ?  ?  ?  ?  ?  ?  ? ?  ?General Comments   ? ?  ?Exercises    ? ?Assessment/Plan  ?  ?PT Assessment Patient needs continued PT services  ?PT Problem List Decreased strength;Decreased range of motion;Decreased activity tolerance;Decreased balance;Decreased mobility;Decreased coordination;Decreased  knowledge of use of DME;Decreased safety awareness;Pain ? ?   ?  ?PT Treatment Interventions DME instruction;Gait training;Functional mobility training;Stair training;Therapeutic activities;Balance training;Therapeutic exercise;Neuromuscular re-education;Patient/family education   ? ?PT Goals (Current goals can be found in the Care Plan section)  ?Acute Rehab PT Goals ?Patient Stated Goal: walking, yard work ?PT Goal Formulation: With patient ?Time For Goal Achievement: 03/29/22 ?Potential to Achieve Goals: Good ? ?  ?Frequency 7X/week ?  ? ? ?Co-evaluation   ?  ?  ?  ?  ? ? ?  ?AM-PAC PT "6 Clicks" Mobility  ?Outcome Measure Help needed turning from your back to your side while in a flat bed without using bedrails?: A Little ?Help needed moving from lying on your back to sitting on the side of a flat bed without using bedrails?: A Little ?Help needed moving to and from a bed to a chair (including a wheelchair)?: A Lot ?Help needed standing up from a chair using your arms (e.g., wheelchair or bedside chair)?: A Little ?Help needed to walk in hospital room?: A Lot ?Help needed climbing 3-5 steps with a railing? : A Lot ?6 Click Score: 15 ? ?  ?End of Session Equipment Utilized During Treatment: Gait belt ?Activity Tolerance: Treatment limited secondary to medical complications (Comment) (dizziness, continued numbness and inability to maintain standing.) ?Patient left: in bed;with call bell/phone within reach;with family/visitor  present;with nursing/sitter in room ?Nurse Communication: Mobility status ?PT Visit Diagnosis: Difficulty in walking, not elsewhere classified (R26.2) ?  ? ?Time: 2446-2863 ?PT Time Calculation (min) (ACUTE ONLY): 21 min ? ? ?Charges:   PT Evaluation ?$PT Eval Low Complexity: 1 Low ?  ?  ?   ? ?Coolidge Breeze, PT, DPT ?[facility] ?Office: [phone] ?Pager: [phone] ? ?Coolidge Breeze ?03/22/2022, 5:14 PM ?

## 2022-03-22 NOTE — Discharge Instructions (Signed)
INSTRUCTIONS AFTER JOINT REPLACEMENT  ? ?Remove items at home which could result in a fall. This includes throw rugs or furniture in walking pathways ?ICE to the affected joint every three hours while awake for 30 minutes at a time, for at least the first 3-5 days, and then as needed for pain and swelling.  Continue to use ice for pain and swelling. You may notice swelling that will progress down to the foot and ankle.  This is normal after surgery.  Elevate your leg when you are not up walking on it.   ?Continue to use the breathing machine you got in the hospital (incentive spirometer) which will help keep your temperature down.  It is common for your temperature to cycle up and down following surgery, especially at night when you are not up moving around and exerting yourself.  The breathing machine keeps your lungs expanded and your temperature down. ? ? ?DIET:  As you were doing prior to hospitalization, we recommend a well-balanced diet. ? ?DRESSING / WOUND CARE / SHOWERING ? ?You may shower 3 days after surgery, but keep the wounds dry during showering.  You may use an occlusive plastic wrap (Press'n Seal for example), NO SOAKING/SUBMERGING IN THE BATHTUB.  If the bandage gets wet, change with a clean dry gauze.  If the incision gets wet, pat the wound dry with a clean towel. ? ?ACTIVITY ? ?Increase activity slowly as tolerated, but follow the weight bearing instructions below.   ?No driving for 6 weeks or until further direction given by your physician.  You cannot drive while taking narcotics.  ?No lifting or carrying greater than 10 lbs. until further directed by your surgeon. ?Avoid periods of inactivity such as sitting longer than an hour when not asleep. This helps prevent blood clots.  ?You may return to work once you are authorized by your doctor.  ? ? ? ?WEIGHT BEARING  ? ?Weight bearing as tolerated with assist device (walker, cane, etc) as directed, use it as long as suggested by your surgeon or  therapist, typically at least 4-6 weeks. ? ? ?EXERCISES ? ?Results after joint replacement surgery are often greatly improved when you follow the exercise, range of motion and muscle strengthening exercises prescribed by your doctor. Safety measures are also important to protect the joint from further injury. Any time any of these exercises cause you to have increased pain or swelling, decrease what you are doing until you are comfortable again and then slowly increase them. If you have problems or questions, call your caregiver or physical therapist for advice.  ? ?Rehabilitation is important following a joint replacement. After just a few days of immobilization, the muscles of the leg can become weakened and shrink (atrophy).  These exercises are designed to build up the tone and strength of the thigh and leg muscles and to improve motion. Often times heat used for twenty to thirty minutes before working out will loosen up your tissues and help with improving the range of motion but do not use heat for the first two weeks following surgery (sometimes heat can increase post-operative swelling).  ? ?These exercises can be done on a training (exercise) mat, on the floor, on a table or on a bed. Use whatever works the best and is most comfortable for you.    Use music or television while you are exercising so that the exercises are a pleasant break in your day. This will make your life better with the exercises acting   as a break in your routine that you can look forward to.   Perform all exercises about fifteen times, three times per day or as directed.  You should exercise both the operative leg and the other leg as well. ? ?Exercises include: ?  ?Quad Sets - Tighten up the muscle on the front of the thigh (Quad) and hold for 5-10 seconds.   ?Straight Leg Raises - With your knee straight (if you were given a brace, keep it on), lift the leg to 60 degrees, hold for 3 seconds, and slowly lower the leg.  Perform this  exercise against resistance later as your leg gets stronger.  ?Leg Slides: Lying on your back, slowly slide your foot toward your buttocks, bending your knee up off the floor (only go as far as is comfortable). Then slowly slide your foot back down until your leg is flat on the floor again.  ?Angel Wings: Lying on your back spread your legs to the side as far apart as you can without causing discomfort.  ?Hamstring Strength:  Lying on your back, push your heel against the floor with your leg straight by tightening up the muscles of your buttocks.  Repeat, but this time bend your knee to a comfortable angle, and push your heel against the floor.  You may put a pillow under the heel to make it more comfortable if necessary.  ? ?A rehabilitation program following joint replacement surgery can speed recovery and prevent re-injury in the future due to weakened muscles. Contact your doctor or a physical therapist for more information on knee rehabilitation.  ? ? ?CONSTIPATION ? ?Constipation is defined medically as fewer than three stools per week and severe constipation as less than one stool per week.  Even if you have a regular bowel pattern at home, your normal regimen is likely to be disrupted due to multiple reasons following surgery.  Combination of anesthesia, postoperative narcotics, change in appetite and fluid intake all can affect your bowels.  ? ?YOU MUST use at least one of the following options; they are listed in order of increasing strength to get the job done.  They are all available over the counter, and you may need to use some, POSSIBLY even all of these options:   ? ?Drink plenty of fluids (prune juice may be helpful) and high fiber foods ?Colace 100 mg by mouth twice a day  ?Senokot for constipation as directed and as needed Dulcolax (bisacodyl), take with full glass of water  ?Miralax (polyethylene glycol) once or twice a day as needed. ? ?If you have tried all these things and are unable to have a  bowel movement in the first 3-4 days after surgery call either your surgeon or your primary doctor.   ? ?If you experience loose stools or diarrhea, hold the medications until you stool forms back up.  If your symptoms do not get better within 1 week or if they get worse, check with your doctor.  If you experience "the worst abdominal pain ever" or develop nausea or vomiting, please contact the office immediately for further recommendations for treatment. ? ? ?ITCHING:  If you experience itching with your medications, try taking only a single pain pill, or even half a pain pill at a time.  You can also use Benadryl over the counter for itching or also to help with sleep.  ? ?TED HOSE STOCKINGS:  Use stockings on both legs until for at least 2 weeks or as directed by  physician office. They may be removed at night for sleeping. ? ?MEDICATIONS:  See your medication summary on the ?After Visit Summary? that nursing will review with you.  You may have some home medications which will be placed on hold until you complete the course of blood thinner medication.  It is important for you to complete the blood thinner medication as prescribed. ? ?PRECAUTIONS:  If you experience chest pain or shortness of breath - call 911 immediately for transfer to the hospital emergency department.  ? ?If you develop a fever greater that 101 F, purulent drainage from wound, increased redness or drainage from wound, foul odor from the wound/dressing, or calf pain - CONTACT YOUR SURGEON.   ?                                                ?FOLLOW-UP APPOINTMENTS:  If you do not already have a post-op appointment, please call the office for an appointment to be seen by your surgeon.  Guidelines for how soon to be seen are listed in your ?After Visit Summary?, but are typically between 1-4 weeks after surgery. ? ?OTHER INSTRUCTIONS:  ? ?Knee Replacement:  Do not place pillow under knee, focus on keeping the knee straight while resting.   ? ?POST-OPERATIVE OPIOID TAPER INSTRUCTIONS: ?It is important to wean off of your opioid medication as soon as possible. If you do not need pain medication after your surgery it is ok to stop day one. ?Opioids i

## 2022-03-22 NOTE — Anesthesia Procedure Notes (Signed)
Anesthesia Regional Block: Adductor canal block  ? ?Pre-Anesthetic Checklist: , timeout performed,  Correct Patient, Correct Site, Correct Laterality,  Correct Procedure, Correct Position, site marked,  Risks and benefits discussed,  Surgical consent,  Pre-op evaluation,  At surgeon's request and post-op pain management ? ?Laterality: Right ? ?Prep: chloraprep     ?  ?Needles:  ?Injection technique: Single-shot ? ?Needle Type: Echogenic Stimulator Needle   ? ? ?Needle Length: 9cm  ?Needle Gauge: 21  ? ? ? ?Additional Needles: ? ? ?Procedures:,,,, ultrasound used (permanent image in chart),,    ?Narrative:  ?Start time: 03/22/2022 9:20 AM ?End time: 03/22/2022 9:25 AM ?Injection made incrementally with aspirations every 5 mL. ? ?Performed by: Personally  ?Anesthesiologist: Effie Berkshire, MD ? ?Additional Notes: ?Patient tolerated the procedure well. Local anesthetic introduced in an incremental fashion under minimal resistance after negative aspirations. No paresthesias were elicited. After completion of the procedure, no acute issues were identified and patient continued to be monitored by RN.  ? ? ? ? ? ?

## 2022-03-22 NOTE — Interval H&P Note (Signed)
History and Physical Interval Note: ? ?03/22/2022 ?9:26 AM ? ?Laurie Ryan  has presented today for surgery, with the diagnosis of DJD RIGHT KNEE.  The various methods of treatment have been discussed with the patient and family. After consideration of risks, benefits and other options for treatment, the patient has consented to  Procedure(s): ?UNICOMPARTMENTAL KNEE (Right) as a surgical intervention.  The patient's history has been reviewed, patient examined, no change in status, stable for surgery.  I have reviewed the patient's chart and labs.  Questions were answered to the patient's satisfaction.   ? ? ?[name] ? ? ?

## 2022-03-22 NOTE — Anesthesia Procedure Notes (Signed)
Procedure Name: [place] ?Date/Time: 03/22/2022 10:44 AM ?Performed by: Niel Hummer, CRNA ?Pre-anesthesia Checklist: Patient identified, Emergency Drugs available, Suction available and Patient being monitored ?Oxygen Delivery Method: Simple face mask ? ? ? ? ?

## 2022-03-22 NOTE — Op Note (Signed)
03/22/2022 ? ?12:41 PM ? ?PATIENT:  Laurie Ryan   ? 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 ?POST-OPERATIVE DIAGNOSIS:  Same ? ?PROCEDURE: RIGHT unicompartmental Knee Arthroplasty ? ?SURGEON:  [name], MD ? ?PHYSICIAN ASSISTANT: Merlene Pulling, PA-C, present and scrubbed throughout the case, critical for completion in a timely fashion, and for retraction, instrumentation, and closure. ? ?ANESTHESIA:   Spinal ? ?ESTIMATED BLOOD LOSS: 75 mL ? ?UNIQUE ASPECTS OF THE CASE: The medial femur was fairly poor bone quality.  This was fragmenting some, and I filled any defects with cement.  The bone quality was overall relatively poor.  The cartilage was still in reasonable condition, but it was collapsing.  The rest of the knee appeared normal.  Initially, the 0 reamer did not take much bone at all.  My gap was 5 mm in flexion, and in extension I could not fit the 1.  After reaming a 5, interestingly the flexion gap tightened up to a 4, I suspect it was because it shifted the arc position on the femur, and it was balanced with a 4 in extension.  The 3 was slightly loose, the 4 was slightly tight, but ultimately the 4 felt the best. ? ?PREOPERATIVE INDICATIONS:  Laurie Ryan is a  71 y.o. female with a diagnosis of DJD RIGHT KNEE who failed conservative measures and elected for surgical management.   ? ?The risks benefits and alternatives were discussed with the patient preoperatively including but not limited to the risks of infection, bleeding, nerve injury, cardiopulmonary complications, blood clots, the need for revision surgery, among others, and the patient was willing to proceed. ? ?OPERATIVE IMPLANTS: [employer] mobile bearing medial compartment arthroplasty femur size small, tibia size B, bearing size 4. ? ?OPERATIVE FINDINGS: The medial femur and tibia had subchondral collapse at the level of the joint line and below.  No significant  changes in the lateral or patellofemoral joint.  The ACL was intact. ? ?OPERATIVE PROCEDURE: The patient was brought to the operating room placed in the supine position. Anesthesia was administered. IV antibiotics were given. The lower extremity was placed in the legholder and prepped and draped in usual sterile fashion. ? ?Time out was performed. ? ?The leg was elevated and exsanguinated and the tourniquet was inflated. Anteromedial incision was performed, and I took care to preserve the MCL. Parapatellar incision was carried out, and the osteophytes were excised, along with the medial meniscus and a small portion of the fat pad. ? ?The extra medullary tibial cutting jig was applied, using the spoon and the 23m G-Clamp and the 2 mm shim, and I took care to protect the anterior cruciate ligament insertion and the tibial spine. The medial collateral ligament was also protected, and I resected my proximal tibia, matching the anatomic slope.  ? ?The proximal tibial bony cut was removed in one piece, and I turned my attention to the femur. ? ?The intramedullary femoral rod was placed using the drill, and then using the appropriate reference, I assembled the femoral jig, setting my posterior cutting block. I resected my posterior femur, used the 0 spigot for the anterior femur, and then measured my gap.  ? ?I then used the appropriate mill to match the extension gap to the flexion gap. The second milling was at a 5.  The gaps were then measured again with the appropriate feeler gauges. Once I had balanced flexion and extension gaps, I then completed the  preparation of the femur. ? ?I milled off the anterior aspect of the distal femur to prevent impingement. I also exposed the tibia, and selected the above-named component, and then used the cutting jig to prepare the keel slot on the tibia. I also used the awl to curette out the bone to complete the preparation of the keel. The back wall was intact. ? ?I then placed trial  components, and it was found to have excellent motion, and appropriate balance. ? ?I then cemented the components into place, cementing the tibia first, removing all excess cement, and then cementing the femur.  All loose cement was removed. ? ?The real polyethylene insert was applied manually, and the knee was taken through functional range of motion, and found to have excellent stability and restoration of joint motion, with excellent balance. ? ?The wounds were irrigated copiously, and the parapatellar tissue closed with Vicryl, followed by Vicryl for the subcutaneous tissue, with routine closure with Steri-Strips and sterile gauze. ? ?The tourniquet was released, and the patient was awakened and extubated and returned to PACU in stable and satisfactory condition. There were no complications. ? ?

## 2022-03-23 DIAGNOSIS — M1711 Unilateral primary osteoarthritis, right knee: Secondary | ICD-10-CM | POA: Diagnosis not present

## 2022-03-23 LAB — BASIC METABOLIC PANEL
Anion gap: 9 (ref 5–15)
BUN: 21 mg/dL (ref 8–23)
CO2: 25 mmol/L (ref 22–32)
Calcium: 8.9 mg/dL (ref 8.9–10.3)
Chloride: 104 mmol/L (ref 98–111)
Creatinine, Ser: 1.17 mg/dL — ABNORMAL HIGH (ref 0.44–1.00)
GFR, Estimated: 51 mL/min — ABNORMAL LOW (ref >60)
Glucose, Bld: 140 mg/dL — ABNORMAL HIGH (ref 70–99)
Potassium: 3.7 mmol/L (ref 3.5–5.1)
Sodium: 138 mmol/L (ref 135–145)

## 2022-03-23 LAB — CBC
HCT: 37.5 % (ref 36.0–46.0)
Hemoglobin: 12.5 g/dL (ref 12.0–15.0)
MCH: 31.7 pg (ref 26.0–34.0)
MCHC: 33.3 g/dL (ref 30.0–36.0)
MCV: 95.2 fL (ref 80.0–100.0)
Platelets: 239 10*3/uL (ref 150–400)
RBC: 3.94 MIL/uL (ref 3.87–5.11)
RDW: 12.1 % (ref 11.5–15.5)
WBC: 10.9 10*3/uL — ABNORMAL HIGH (ref 4.0–10.5)
nRBC: 0 % (ref 0.0–0.2)

## 2022-03-23 MED ORDER — TRAMADOL HCL 50 MG PO TABS
50.0000 mg | ORAL_TABLET | ORAL | Status: DC | PRN
  Administered 2022-03-23: 50 mg via ORAL
  Filled 2022-03-23: qty 1

## 2022-03-23 MED ORDER — HYDRALAZINE HCL 20 MG/ML IJ SOLN
5.0000 mg | Freq: Once | INTRAMUSCULAR | Status: AC
  Administered 2022-03-23: 5 mg via INTRAVENOUS
  Filled 2022-03-23: qty 1

## 2022-03-23 MED ORDER — TRAMADOL HCL 50 MG PO TABS: 50.0000 mg | ORAL_TABLET | ORAL | 0 refills | Status: AC | PRN

## 2022-03-23 NOTE — Progress Notes (Signed)
Physical Therapy Treatment ?Patient Details ?Name: Laurie Ryan ?MRN: [id number] ?DOB: [birth year] ?Today's Date: 03/23/2022 ? ? ?History of Present Illness Pt is a 71yo female presenting s/p R-unicompartmental knee on 03/22/22. PMH: OA, depression, HTN, R-RCR 2019, R-ORIF distal radius rx 2022 ? ?  ?PT Comments  ? ? POD # 1 am session ?General Comments: AxO x 3 very pleasant and eager to go home.  Assisted OOB to amb in hallway went well.  General transfer comment: one VC on proper hand placement and safety with turns.  Performed well. General Gait Details: tolerated well with a functional distance and only one VC on correction with walker usage during turns. General stair comments: 25% VC's on proper sequencing and safety, pt was able to navigate stairs safely. Then returned to room to perform some TE's following HEP handout.  Instructed on proper tech, freq as well as use of ICE.   ?Addressed all mobility questions, discussed appropriate activity, educated on use of ICE.  Pt ready for D/C to home. ?  ?Recommendations for follow up therapy are one component of a multi-disciplinary discharge planning process, led by the attending physician.  Recommendations may be updated based on patient status, additional functional criteria and insurance authorization. ? ?Follow Up Recommendations ? Follow physician's recommendations for discharge plan and follow up therapies ?  ?  ?Assistance Recommended at Discharge Intermittent Supervision/Assistance  ?Patient can return home with the following A little help with walking and/or transfers;A little help with bathing/dressing/bathroom;Assistance with cooking/housework;Assist for transportation;Help with stairs or ramp for entrance ?  ?Equipment Recommendations ? Rolling walker (2 wheels)  ?  ?Recommendations for Other Services   ? ? ?  ?Precautions / Restrictions Precautions ?Precautions: Fall ?Precaution Comments: instructed no pillow under knee ?Restrictions ?Weight Bearing  Restrictions: No ?Other Position/Activity Restrictions: WBAT  ?  ? ?Mobility ? Bed Mobility ?  ?Bed Mobility: Supine to Sit ?  ?  ?Supine to sit: Supervision ?  ?  ?General bed mobility comments: self able with increased time ?  ? ?Transfers ?Overall transfer level: Needs assistance ?Equipment used: Rolling walker (2 wheels) ?  ?Sit to Stand: Supervision, Min guard ?  ?  ?  ?  ?  ?General transfer comment: one VC on proper hand placement and safety with turns.  Performed well. ?  ? ?Ambulation/Gait ?Ambulation/Gait assistance: Supervision, Min guard ?Gait Distance (Feet): 80 Feet ?Assistive device: Rolling walker (2 wheels) ?Gait Pattern/deviations: Step-to pattern ?Gait velocity: decreased ?  ?  ?General Gait Details: tolerated well with a functional distance and only one VC on correction with walker usage during turns. ? ? ?Stairs ?Stairs: Yes ?Stairs assistance: Supervision, Min guard ?Stair Management: Two rails, Alternating pattern, Forwards ?Number of Stairs: 2 ?General stair comments: 25% VC's on proper sequencing and safety, pt was able to navigate stairs safely. ? ? ?Wheelchair Mobility ?  ? ?Modified Rankin (Stroke Patients Only) ?  ? ? ?  ?Balance   ?  ?  ?  ?  ?  ?  ?  ?  ?  ?  ?  ?  ?  ?  ?  ?  ?  ?  ?  ? ?  ?Cognition Arousal/Alertness: Awake/alert ?Behavior During Therapy: [facility] for tasks assessed/performed ?Overall Cognitive Status: Within Functional Limits for tasks assessed ?  ?  ?  ?  ?  ?  ?  ?  ?  ?  ?  ?  ?  ?  ?  ?  ?General Comments:  AxO x 3 very pleasant and eager to go home ?  ?  ? ?  ?Exercises   ?Uni Knee Replacement TE's following HEP handout ?10 reps B LE ankle pumps ?05 reps towel squeezes ?05 reps knee presses ?05 reps heel slides  ?05 reps SAQ's ?05 reps SLR's ?05 reps ABD ?Educated on use of gait belt to assist with TE's ?Followed by ICE ? ?  ?General Comments   ?  ?  ? ?Pertinent Vitals/Pain Pain Assessment ?Pain Assessment: 0-10 ?Pain Score: 3  ?Pain Location: R knee ?Pain  Descriptors / Indicators: Tender, Tightness, Operative site guarding ?Pain Intervention(s): Monitored during session, Premedicated before session, Repositioned, Ice applied  ? ? ?Home Living   ?  ?  ?  ?  ?  ?  ?  ?  ?  ?   ?  ?Prior Function    ?  ?  ?   ? ?PT Goals (current goals can now be found in the care plan section)   ? ?  ?Frequency ? ? ? 7X/week ? ? ? ?  ?PT Plan Current plan remains appropriate  ? ? ?Co-evaluation   ?  ?  ?  ?  ? ?  ?AM-PAC PT "6 Clicks" Mobility   ?Outcome Measure ? Help needed turning from your back to your side while in a flat bed without using bedrails?: None ?Help needed moving from lying on your back to sitting on the side of a flat bed without using bedrails?: None ?Help needed moving to and from a bed to a chair (including a wheelchair)?: None ?Help needed standing up from a chair using your arms (e.g., wheelchair or bedside chair)?: None ?Help needed to walk in hospital room?: None ?Help needed climbing 3-5 steps with a railing? : A Little ?6 Click Score: 23 ? ?  ?End of Session Equipment Utilized During Treatment: Gait belt ?Activity Tolerance: Patient tolerated treatment well ?Patient left: in chair;with call bell/phone within reach;with chair alarm set;with family/visitor present ?Nurse Communication: Mobility status ?PT Visit Diagnosis: Difficulty in walking, not elsewhere classified (R26.2) ?  ? ? ?Time: 1040-1120 ?PT Time Calculation (min) (ACUTE ONLY): 40 min ? ?Charges:  $Gait Training: 8-22 mins ?$Therapeutic Exercise: 8-22 mins ?$Therapeutic Activity: 8-22 mins          ?          ? ?{Michole Lecuyer  PTA ?Acute  Rehabilitation Services ?Pager      [phone] ?Office      [phone] ? ?

## 2022-03-23 NOTE — Discharge Summary (Signed)
Discharge Summary  ?Patient ID: ?Laurie Ryan ?MRN: [id number] ?DOB/AGE: March 04, 1951 71 y.o. ? ?Admit date: 03/22/2022 ?Discharge date: 03/23/2022 ? ?Admission Diagnoses:  ?S/P right unicompartmental knee replacement ? ?Discharge Diagnoses:  ?Principal Problem: ?  S/P right unicompartmental knee replacement ? ? ?Past Medical History:  ?Diagnosis Date  ? Arthritis   ? lower back, shoulders  ? Cancer [facility])   ? skin ca  ? Depression   ? Hypertension   ? ? ?Surgeries: Procedure(s): ?UNICOMPARTMENTAL KNEE on 03/22/2022 ?  ?Consultants (if any):  ? ?Discharged Condition: Improved ? ?Hospital Course: Laurie Ryan is an 71 y.o. female who was admitted 03/22/2022 with a diagnosis of S/P right unicompartmental knee replacement and went to the operating room on 03/22/2022 and underwent the above named procedures.   ? ?She was given perioperative antibiotics:  ?Anti-infectives (From admission, onward)  ? ? Start     Dose/Rate Route Frequency Ordered Stop  ? 03/23/22 0000  ceFAZolin (ANCEF) IVPB 2g/100 mL premix       ? 2 g ?200 mL/hr over 30 Minutes Intravenous Every 6 hours 03/22/22 1722 03/23/22 0625  ? 03/22/22 1740  ceFAZolin (ANCEF) 2-4 GM/100ML-% IVPB       ?Note to Pharmacy: Estell Harpin C: cabinet override  ?    03/22/22 1740 03/22/22 1814  ? 03/22/22 0830  ceFAZolin (ANCEF) IVPB 2g/100 mL premix       ? 2 g ?200 mL/hr over 30 Minutes Intravenous On call to O.R. 03/22/22 0817 03/22/22 1123  ? ?  ?. ? ?She was given sequential compression devices, early ambulation, and aspirin for DVT prophylaxis. ? ?She benefited maximally from the hospital stay and there were no complications.   ? ?Recent vital signs:  ?Vitals:  ? 03/23/22 0555 03/23/22 0943  ?BP:  (!) 152/91  ?Pulse: 98 98  ?Resp:  17  ?Temp:  98 ?F (36.7 ?C)  ?SpO2: 96% 95%  ? ? ?Recent laboratory studies:  ?Lab Results  ?Component Value Date  ? HGB 12.5 03/23/2022  ? HGB 13.1 03/15/2022  ? HGB 12.3 12/01/2020  ? ?Lab Results  ?Component Value Date  ? WBC 10.9 (H)  03/23/2022  ? PLT 239 03/23/2022  ? ?No results found for: INR ?Lab Results  ?Component Value Date  ? NA 138 03/23/2022  ? K 3.7 03/23/2022  ? CL 104 03/23/2022  ? CO2 25 03/23/2022  ? BUN 21 03/23/2022  ? CREATININE 1.17 (H) 03/23/2022  ? GLUCOSE 140 (H) 03/23/2022  ? ? ?Discharge Medications:   ?Allergies as of 03/23/2022   ? ?   Reactions  ? Codeine Other (See Comments)  ? Passed out  ? ?  ? ?  ?Medication List  ?  ? ?TAKE these medications   ? ?aspirin EC 325 MG tablet ?Take 1 tablet (325 mg total) by mouth 2 (two) times daily. ?What changed:  ?medication strength ?how much to take ?when to take this ?  ?azelastine 0.1 % nasal spray ?Commonly known as: ASTELIN ?Place 1 spray into the nose daily as needed for rhinitis. ?  ?benazepril 5 MG tablet ?Commonly known as: LOTENSIN ?Take 5 mg by mouth daily. ?  ?escitalopram 10 MG tablet ?Commonly known as: LEXAPRO ?Take 10 mg by mouth daily. ?  ?fluticasone 50 MCG/ACT nasal spray ?Commonly known as: FLONASE ?Place 2 sprays into both nostrils daily as needed for allergies. ?  ?hydrochlorothiazide 25 MG tablet ?Commonly known as: HYDRODIURIL ?Take 25 mg by mouth daily. ?  ?multivitamin  capsule ?Take 1 capsule by mouth daily. ?  ?ondansetron 4 MG tablet ?Commonly known as: Zofran ?Take 1 tablet (4 mg total) by mouth every 8 (eight) hours as needed for nausea or vomiting. ?  ?sennosides-docusate sodium 8.6-50 MG tablet ?Commonly known as: SENOKOT-S ?Take 2 tablets by mouth daily. ?  ?traMADol 50 MG tablet ?Commonly known as: Ultram ?Take 1 tablet (50 mg total) by mouth every 4 (four) hours as needed. ?What changed:  ?when to take this ?reasons to take this ?  ? ?  ? ? ?Diagnostic Studies: DG Knee Right Port ? ?Result Date: 03/22/2022 ?CLINICAL DATA:  Status post right knee arthroplasty. EXAM: PORTABLE RIGHT KNEE - 1-2 VIEW COMPARISON:  January 01, 2022. FINDINGS: Status post medial hemiarthroplasty. Femoral and tibial components are well situated. Expected postoperative  changes are seen in the soft tissues anteriorly. IMPRESSION: Status post right medial hemiarthroplasty. Electronically Signed   By: Marijo Conception M.D.   On: 03/22/2022 13:54   ? ?Disposition: Discharge disposition: 01-Home or Self Care ? ? ? ? ? ? ? ? ? Follow-up Information   ? ? Marchia Bond, MD. Schedule an appointment as soon as possible for a visit in 2 week(s).   ?Specialty: Orthopedic Surgery ?Contact information: ?[address] ?[address] ?[place] [address] ?[phone] ? ? ?  ?  ? ?  ?  ? ?  ? ? ? ?Signed: ?Ventura Bruns PA-C ?03/23/2022, 11:32 AM ? ?  ?

## 2022-03-23 NOTE — TOC Transition Note (Signed)
Transition of Care (TOC) - CM/SW Discharge Note ? ?Patient Details  ?Name: Laurie Ryan ?MRN: [id number] ?Date of Birth: August 28, 1951 ? ?Transition of Care (TOC) CM/SW Contact:  ?Sherie Don, LCSW ?Phone Number: ?03/23/2022, 10:05 AM ? ?Clinical Narrative: Patient is expected to discharge home after working with PT. CSW met with patient to confirm discharge plan and needs. Patient will discharge home with OPPT, but cannot remember the name of the location. Patient will need a rolling walker, which was delivered to patient's room by [employer]. TOC signing off. ? ?Final next level of care: OP Rehab ?Barriers to Discharge: No Barriers Identified ? ?Patient Goals and CMS Choice ?Patient states their goals for this hospitalization and ongoing recovery are:: Discharge home with OPPT ?CMS Medicare.gov Compare Post Acute Care list provided to:: Patient ?Choice offered to / list presented to : Patient ? ?Discharge Plan and Services       ?DME Arranged: Walker rolling ?DME Agency: [employer] ?Date DME Agency Contacted: 03/23/22 ?Representative spoke with at DME Agency: Wells Guiles ? ?Readmission Risk Interventions ?   ? View : No data to display.  ?  ?  ?  ? ?

## 2022-03-23 NOTE — Progress Notes (Signed)
? ? ? ?  Subjective: ?1 Day Post-Op s/p Procedure(s): ?UNICOMPARTMENTAL KNEE ? ? Patient is alert, oriented. Didn't sleep well last night.  ?Patient reports pain as well controlled. She felt like oxycodone was too potent, made her feel like not herself. Passing gas and bowel movement.  ?Denies chest pain, SOB, Calf pain. No nausea/vomiting. No other complaints. ?  ? ?Objective:  ?PE: ?VITALS:   ?Vitals:  ? 03/23/22 0128 03/23/22 0143 03/23/22 0542 03/23/22 0555  ?BP: (!) 174/91 (!) 177/92 (!) 173/79   ?Pulse: 96 100 (!) 107 98  ?Resp: '17 16 17   '$ ?Temp: 97.6 ?F (36.4 ?C)  98 ?F (36.7 ?C)   ?TempSrc: Oral  Oral   ?SpO2: 95% 93% 95% 96%  ?Weight:      ?Height:      ? ? ?ABD soft ?Sensation intact distally ?Intact pulses distally ?Dorsiflexion/Plantar flexion intact ?Incision: no drainage ? ?LABS ? ?Results for orders placed or performed during the hospital encounter of 03/22/22 (from the past 24 hour(s))  ?CBC     Status: Abnormal  ? Collection Time: 03/23/22  3:37 AM  ?Result Value Ref Range  ? WBC 10.9 (H) 4.0 - 10.5 K/uL  ? RBC 3.94 3.87 - 5.11 MIL/uL  ? Hemoglobin 12.5 12.0 - 15.0 g/dL  ? HCT 37.5 36.0 - 46.0 %  ? MCV 95.2 80.0 - 100.0 fL  ? MCH 31.7 26.0 - 34.0 pg  ? MCHC 33.3 30.0 - 36.0 g/dL  ? RDW 12.1 11.5 - 15.5 %  ? Platelets 239 150 - 400 K/uL  ? nRBC 0.0 0.0 - 0.2 %  ?Basic metabolic panel     Status: Abnormal  ? Collection Time: 03/23/22  3:37 AM  ?Result Value Ref Range  ? Sodium 138 135 - 145 mmol/L  ? Potassium 3.7 3.5 - 5.1 mmol/L  ? Chloride 104 98 - 111 mmol/L  ? CO2 25 22 - 32 mmol/L  ? Glucose, Bld 140 (H) 70 - 99 mg/dL  ? BUN 21 8 - 23 mg/dL  ? Creatinine, Ser 1.17 (H) 0.44 - 1.00 mg/dL  ? Calcium 8.9 8.9 - 10.3 mg/dL  ? GFR, Estimated 51 (L) >60 mL/min  ? Anion gap 9 5 - 15  ? ? ?DG Knee Right Port ? ?Result Date: 03/22/2022 ?CLINICAL DATA:  Status post right knee arthroplasty. EXAM: PORTABLE RIGHT KNEE - 1-2 VIEW COMPARISON:  January 01, 2022. FINDINGS: Status post medial hemiarthroplasty.  Femoral and tibial components are well situated. Expected postoperative changes are seen in the soft tissues anteriorly. IMPRESSION: Status post right medial hemiarthroplasty. Electronically Signed   By: Marijo Conception M.D.   On: 03/22/2022 13:54   ? ?Assessment/Plan: ?Principal Problem: ?  S/P right unicompartmental knee replacement ? ? ? ?1 Day Post-Op s/p Procedure(s): ?UNICOMPARTMENTAL KNEE ? ?Weightbearing: WBAT RLE ?Insicional and dressing care: Reinforce dressings as needed ?VTE prophylaxis: Aspirin '325mg'$  BID x 30 days ?Pain control: will d/c oxycodone, patient has used tramadol in the past, this doesn't not make her feel as bad. Will add this on instead.  ?Follow - up plan: 2 weeks with Dr. Mardelle Matte ?Dispo: pending further improvement with PT and stair training today. Plan is to discharge home this afternoon after PT.  ?Contact information:   ?Merlene Pulling, [place] Weekdays 8-5  ?After hours and holidays please check [email or web address] for group call information for [facility] ? ?Ventura Bruns ?03/23/2022, 8:31 AM  ?

## 2022-03-23 NOTE — Plan of Care (Signed)
  Problem: Education: Goal: Knowledge of General Education information will improve Description: Including pain rating scale, medication(s)/side effects and non-pharmacologic comfort measures Outcome: Progressing   Problem: Activity: Goal: Risk for activity intolerance will decrease Outcome: Progressing   Problem: Pain Managment: Goal: General experience of comfort will improve Outcome: Progressing   

## 2022-03-25 ENCOUNTER — Encounter (HOSPITAL_COMMUNITY): Payer: Self-pay | Admitting: Orthopedic Surgery

## 2022-05-16 ENCOUNTER — Ambulatory Visit
Admission: RE | Admit: 2022-05-16 | Discharge: 2022-05-16 | Disposition: A | Discharge status: Home / Self Care | Payer: Medicare Other | Source: Ambulatory Visit | Attending: Family Medicine | Admitting: Family Medicine

## 2022-05-16 DIAGNOSIS — Z1231 Encounter for screening mammogram for malignant neoplasm of breast: Secondary | ICD-10-CM | POA: Insufficient documentation

## 2023-05-01 ENCOUNTER — Other Ambulatory Visit: Payer: Self-pay | Admitting: Family Medicine

## 2023-05-01 DIAGNOSIS — Z1231 Encounter for screening mammogram for malignant neoplasm of breast: Secondary | ICD-10-CM

## 2023-06-07 ENCOUNTER — Other Ambulatory Visit: Payer: Self-pay | Admitting: Family Medicine

## 2023-06-07 DIAGNOSIS — Z1231 Encounter for screening mammogram for malignant neoplasm of breast: Secondary | ICD-10-CM

## 2023-06-23 ENCOUNTER — Ambulatory Visit
Admission: RE | Admit: 2023-06-23 | Discharge: 2023-06-23 | Disposition: A | Discharge status: Home / Self Care | Payer: Medicare Other | Source: Ambulatory Visit | Attending: Family Medicine | Admitting: Family Medicine

## 2023-06-23 DIAGNOSIS — Z1231 Encounter for screening mammogram for malignant neoplasm of breast: Secondary | ICD-10-CM | POA: Insufficient documentation

## 2023-09-11 IMAGING — DX DG KNEE 1-2V PORT*R*
2 series · 2 of 2 positions shown · non-contrast
Comparison: January 01, 2022.

CLINICAL DATA: Status post right knee arthroplasty.

EXAM:
PORTABLE RIGHT KNEE - 1-2 VIEW

[knee lat]
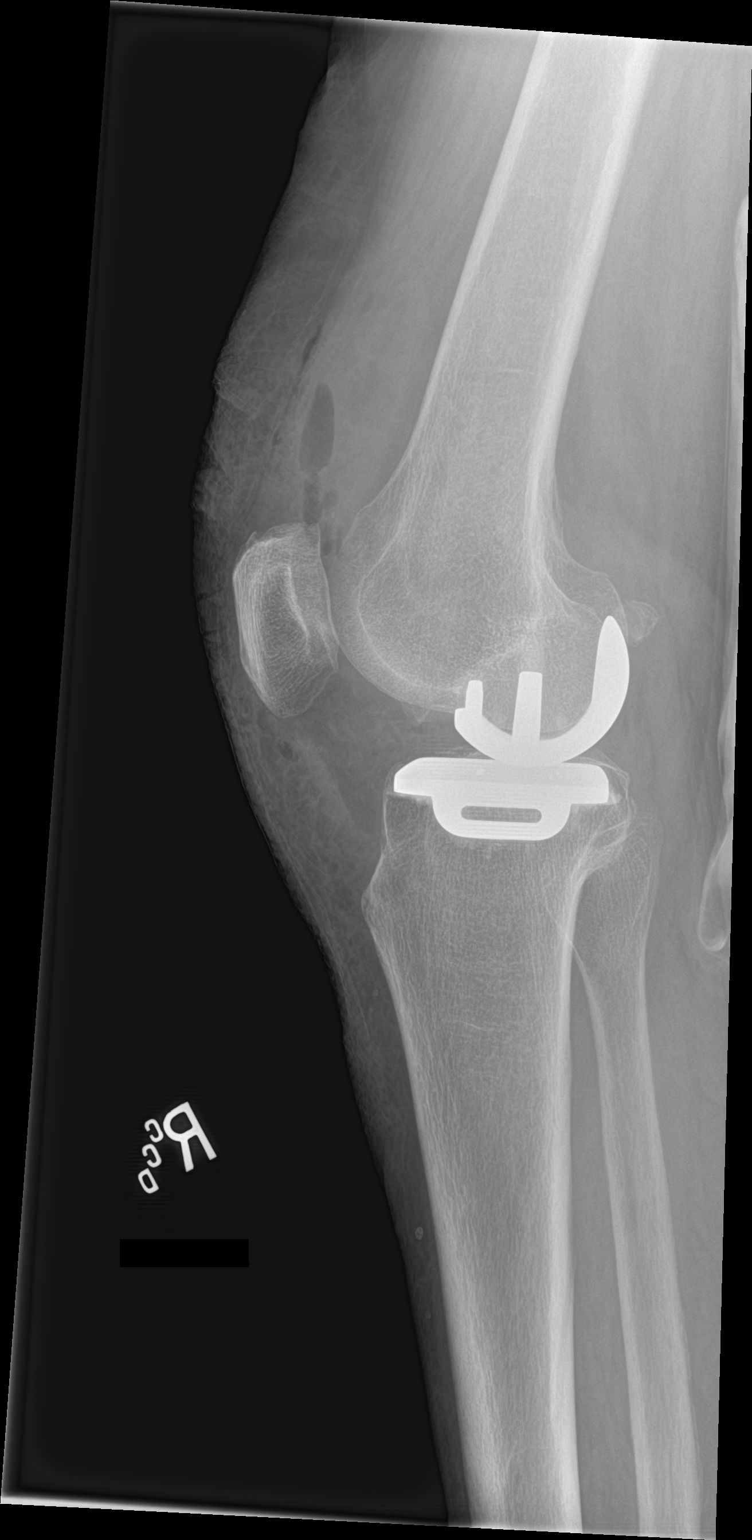

[knee ap]
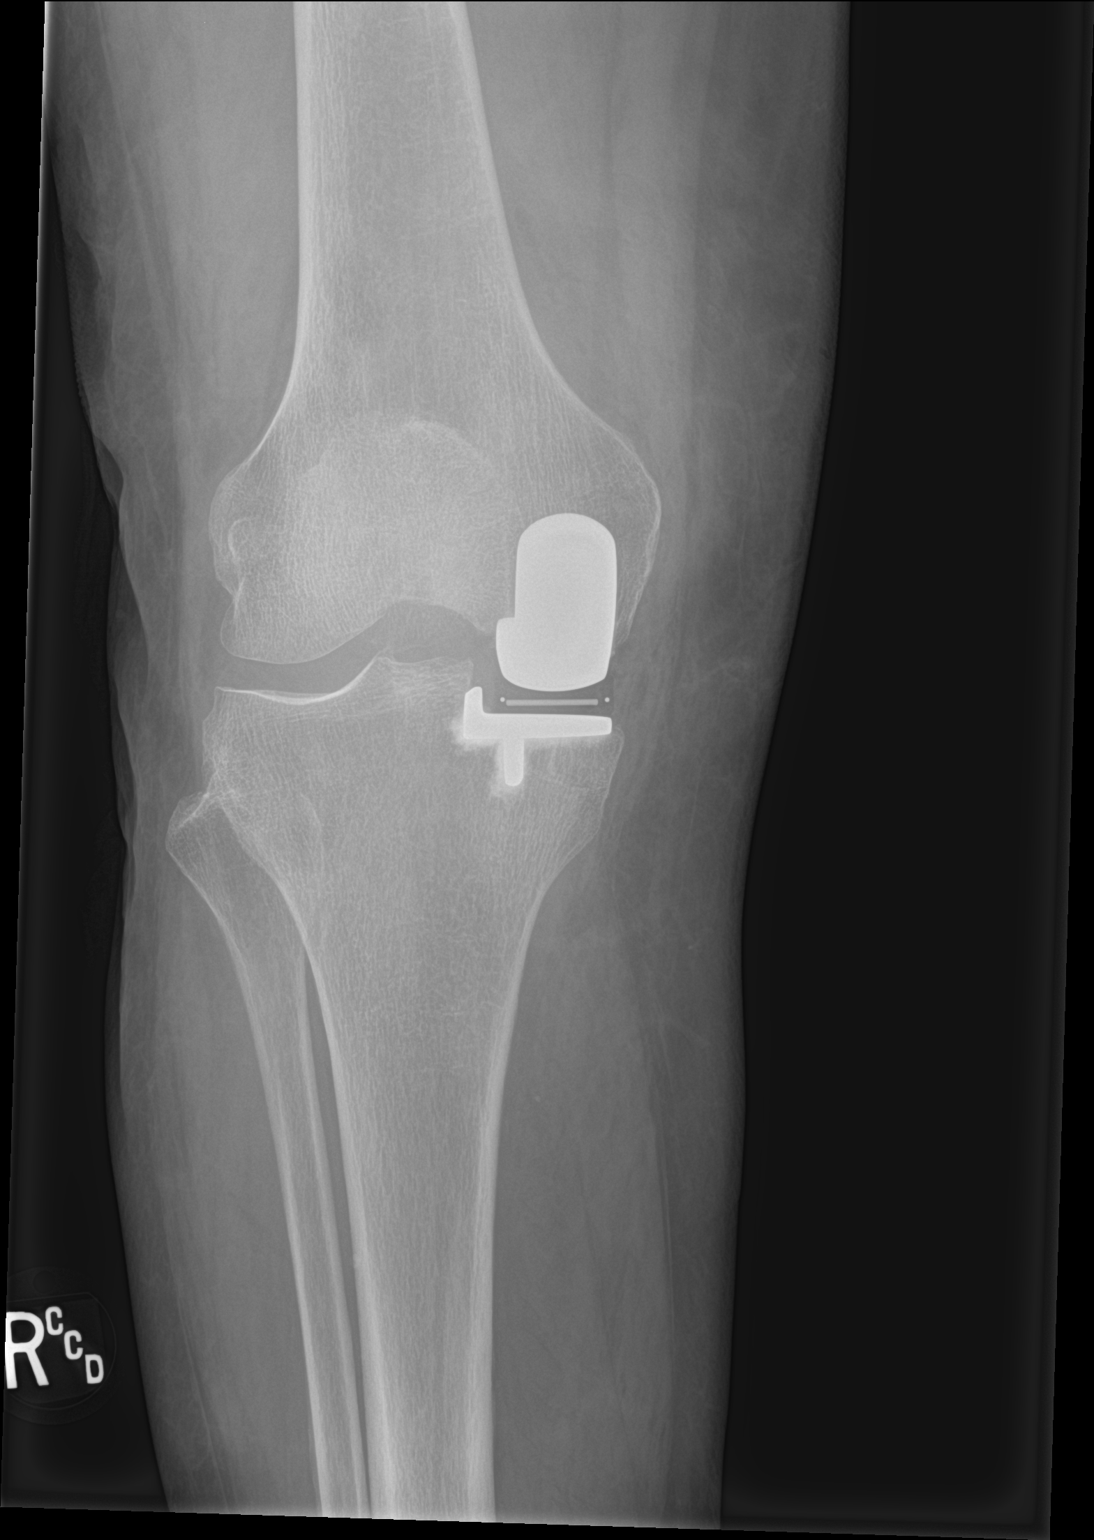

[2 of 2 positions shown; findings below may reference images not displayed]

FINDINGS: Status post medial hemiarthroplasty. Femoral and tibial components
are well situated. Expected postoperative changes are seen in the
soft tissues anteriorly.
IMPRESSION: Status post right medial hemiarthroplasty.

## 2024-02-19 ENCOUNTER — Other Ambulatory Visit: Payer: Self-pay | Admitting: Family Medicine

## 2024-02-19 DIAGNOSIS — Z1231 Encounter for screening mammogram for malignant neoplasm of breast: Secondary | ICD-10-CM

## 2024-06-24 ENCOUNTER — Ambulatory Visit
Admission: RE | Admit: 2024-06-24 | Discharge: 2024-06-24 | Disposition: A | Discharge status: Home / Self Care | Source: Ambulatory Visit | Attending: Family Medicine | Admitting: Family Medicine

## 2024-06-24 DIAGNOSIS — Z1231 Encounter for screening mammogram for malignant neoplasm of breast: Secondary | ICD-10-CM | POA: Insufficient documentation

## 2024-11-15 ENCOUNTER — Other Ambulatory Visit: Payer: Self-pay

## 2024-11-15 ENCOUNTER — Emergency Department

## 2024-11-15 DIAGNOSIS — I10 Essential (primary) hypertension: Secondary | ICD-10-CM | POA: Insufficient documentation

## 2024-11-15 DIAGNOSIS — Z85828 Personal history of other malignant neoplasm of skin: Secondary | ICD-10-CM | POA: Insufficient documentation

## 2024-11-15 DIAGNOSIS — N179 Acute kidney failure, unspecified: Secondary | ICD-10-CM | POA: Insufficient documentation

## 2024-11-15 LAB — CBC
HCT: 41.6 % (ref 36.0–46.0)
Hemoglobin: 13.9 g/dL (ref 12.0–15.0)
MCH: 31.7 pg (ref 26.0–34.0)
MCHC: 33.4 g/dL (ref 30.0–36.0)
MCV: 94.8 fL (ref 80.0–100.0)
Platelets: 220 K/uL (ref 150–400)
RBC: 4.39 MIL/uL (ref 3.87–5.11)
RDW: 11.9 % (ref 11.5–15.5)
WBC: 7.8 K/uL (ref 4.0–10.5)
nRBC: 0 % (ref 0.0–0.2)

## 2024-11-15 NOTE — ED Triage Notes (Signed)
 Pt presents for increased BP. Had dental implant surgery today. Took normal night time medications and just not feeling right. No facial droop, speech abnormalities or other neurological symptoms noted. Took BP at home and found to have systolic 190s (not normal for her)  Past Medical History:  Diagnosis Date   Arthritis    lower back, shoulders   Cancer (HCC)    skin ca   Depression    Hypertension

## 2024-11-16 ENCOUNTER — Emergency Department
Admission: EM | Admit: 2024-11-16 | Discharge: 2024-11-16 | Disposition: A | Attending: Emergency Medicine | Admitting: Emergency Medicine

## 2024-11-16 DIAGNOSIS — N179 Acute kidney failure, unspecified: Secondary | ICD-10-CM

## 2024-11-16 DIAGNOSIS — I10 Essential (primary) hypertension: Secondary | ICD-10-CM

## 2024-11-16 LAB — BASIC METABOLIC PANEL WITH GFR
Anion gap: 12 (ref 5–15)
BUN: 25 mg/dL — ABNORMAL HIGH (ref 8–23)
CO2: 23 mmol/L (ref 22–32)
Calcium: 9.7 mg/dL (ref 8.9–10.3)
Chloride: 104 mmol/L (ref 98–111)
Creatinine, Ser: 1.53 mg/dL — ABNORMAL HIGH (ref 0.44–1.00)
GFR, Estimated: 36 mL/min — ABNORMAL LOW
Glucose, Bld: 130 mg/dL — ABNORMAL HIGH (ref 70–99)
Potassium: 3.8 mmol/L (ref 3.5–5.1)
Sodium: 140 mmol/L (ref 135–145)

## 2024-11-16 LAB — TROPONIN T, HIGH SENSITIVITY
Troponin T High Sensitivity: 20 ng/L — ABNORMAL HIGH (ref 0–19)
Troponin T High Sensitivity: 21 ng/L — ABNORMAL HIGH (ref 0–19)

## 2024-11-16 LAB — URINALYSIS, ROUTINE W REFLEX MICROSCOPIC
Bilirubin Urine: NEGATIVE
Glucose, UA: NEGATIVE mg/dL
Hgb urine dipstick: NEGATIVE
Ketones, ur: NEGATIVE mg/dL
Leukocytes,Ua: NEGATIVE
Nitrite: NEGATIVE
Protein, ur: NEGATIVE mg/dL
Specific Gravity, Urine: 1.014 (ref 1.005–1.030)
pH: 5 (ref 5.0–8.0)

## 2024-11-16 MED ORDER — SODIUM CHLORIDE 0.9 % IV BOLUS
500.0000 mL | Freq: Once | INTRAVENOUS | Status: AC
  Administered 2024-11-16: 500 mL via INTRAVENOUS

## 2024-11-16 NOTE — Discharge Instructions (Addendum)
As we discussed, though you do have high blood pressure (hypertension), fortunately it is not immediately dangerous at this time and does not need emergency intervention or admission to the hospital.  If we add to or change your regular medications, we may cause more harm than good - it is more appropriate for your primary care doctor to evaluate you in clinic and decide if any medication changes are needed.  Please follow up in clinic as recommended in these papers.   ° °Return to the Emergency Department (ED) if you experience any worsening chest pain/pressure/tightness, difficulty breathing, or sudden sweating, or other symptoms that concern you. °

## 2024-11-16 NOTE — ED Provider Notes (Signed)
 "  [facility] Provider Note    Event Date/Time   First MD Initiated Contact with Patient 11/16/24 0050     (approximate)   History   Hypertension   HPI Laurie Ryan is a 74 y.o. female whose medical history includes hypertension and presents for evaluation of elevated blood pressure measurements at home.  She had dental implant surgery earlier today about which she has been nervous.  It went well, and she says she normally takes her blood pressure medicine in the evening.  She said she did not feel quite right but without any specific concerns or symptoms.  When she checked her blood pressure at home it was about 190 systolic.  She took her blood pressure medicine and then came to the emergency department.  She said that she feels fine now.  She said that she think she has been eating and drinking okay, no nausea or vomiting, no diarrhea.  Denies chest pain and shortness of breath.     Physical Exam   Triage Vital Signs: ED Triage Vitals [11/15/24 2335]  Encounter Vitals Group     BP (!) 174/77     Girls Systolic BP Percentile      Girls Diastolic BP Percentile      Boys Systolic BP Percentile      Boys Diastolic BP Percentile      Pulse Rate 99     Resp 18     Temp 98.6 F (37 C)     Temp Source Oral     SpO2 96 %     Weight 65.8 kg (145 lb)     Height 1.651 m (5' 5)     Head Circumference      Peak Flow      Pain Score 0     Pain Loc      Pain Education      Exclude from Growth Chart     Most recent vital signs: Vitals:   11/15/24 2335  BP: (!) 174/77  Pulse: 99  Resp: 18  Temp: 98.6 F (37 C)  SpO2: 96%    General: Awake, no distress.  Pleasant, conversant, well-appearing.  Ambulatory around the department without any difficulties. CV:  Good peripheral perfusion.  Easily palpable radial pulses, normal heart sounds, regular rate and rhythm. Resp:  Normal effort. Speaking easily and comfortably, no accessory muscle usage nor  intercostal retractions.  Lungs are clear to auscultation. Abd:  No distention.    ED Results / Procedures / Treatments   Labs (all labs ordered are listed, but only abnormal results are displayed) Labs Reviewed  BASIC METABOLIC PANEL WITH GFR - Abnormal; Notable for the following components:      Result Value   Glucose, Bld 130 (*)    BUN 25 (*)    Creatinine, Ser 1.53 (*)    GFR, Estimated 36 (*)    All other components within normal limits  URINALYSIS, ROUTINE W REFLEX MICROSCOPIC - Abnormal; Notable for the following components:   Color, Urine YELLOW (*)    APPearance CLEAR (*)    All other components within normal limits  TROPONIN T, HIGH SENSITIVITY - Abnormal; Notable for the following components:   Troponin T High Sensitivity 21 (*)    All other components within normal limits  TROPONIN T, HIGH SENSITIVITY - Abnormal; Notable for the following components:   Troponin T High Sensitivity 20 (*)    All other components within normal limits  CBC  PROCEDURES:  Critical Care performed: No  Procedures    Radiology:  I independently viewed and interpreted the patient's two-view chest x-ray and I see no evidence of pulmonary edema or other acute abnormality.  I also read the radiologist's report, which confirmed no acute findings.   IMPRESSION / MDM / ASSESSMENT AND PLAN / ED COURSE  I reviewed the triage vital signs and the nursing notes.                              Differential diagnosis includes, but is not limited to, primary hypertension, situational hypertension, electrolyte or metabolic abnormality, hypertensive emergency.  Patient's presentation is most consistent with acute presentation with potential threat to life or bodily function.  Labs/studies ordered (see ED course for additional labs and studies that may have been added later): High-sensitivity troponin, basic metabolic panel, urinalysis, CBC, two-view chest x-ray  Interventions/Medications  given:  Medications  sodium chloride  0.9 % bolus 500 mL (0 mLs Intravenous Stopped 11/16/24 0205)    (Note:  hospital course my include additional interventions and/or labs/studies not listed above.)   Patient has been stable in the emergency department for about 3 hours.  Blood pressure has come down to the 170s which is still elevated but not dangerously so, and she is asymptomatic.  She has a bit of an acute kidney injury which I do not think is a result of her elevated blood pressure and rather more of a chronic issue (her last creatinine on record was from 2 years ago).  She has urinated several times after getting a 500 mL liter normal saline bolus and is ambulatory without difficulty.  Her high-sensitivity troponin is very slightly elevated but stable and not representative of acute ischemia.  Overall she is very well-appearing and is comfortable with the plan for discharge and outpatient follow-up.  I discussed with her uncontrolled hypertension but explained that she just needs to take her regular medications and follow-up with her primary care doctor and she is comfortable with that.  I gave my usual return precautions.  The patient's medical screening exam is reassuring with no indication of an emergent medical condition requiring hospitalization or additional evaluation at this point.  The patient is safe and appropriate for discharge and outpatient follow up.         FINAL CLINICAL IMPRESSION(S) / ED DIAGNOSES   Final diagnoses:  Uncontrolled hypertension  Acute kidney injury     Rx / DC Orders   ED Discharge Orders     None        Note:  This document was prepared using Dragon voice recognition software and may include unintentional dictation errors.   Gordan Huxley, MD 11/16/24 0230  "
